# Patient Record
Sex: Female | Born: 1980
Health system: Southern US, Community
[De-identification: ages and names within clinical notes are randomized; demographics above are authoritative.]

## PROBLEM LIST (undated history)

## (undated) DIAGNOSIS — D649 Anemia, unspecified: Secondary | ICD-10-CM

## (undated) DIAGNOSIS — F419 Anxiety disorder, unspecified: Secondary | ICD-10-CM

## (undated) DIAGNOSIS — K5900 Constipation, unspecified: Secondary | ICD-10-CM

## (undated) HISTORY — PX: ELBOW SURGERY: SHX618

## (undated) HISTORY — PX: WISDOM TOOTH EXTRACTION: SHX21

## (undated) HISTORY — DX: Constipation, unspecified: K59.00

## (undated) HISTORY — PX: REDUCTION MAMMAPLASTY: SUR839

## (undated) HISTORY — DX: Anemia, unspecified: D64.9

---

## 2009-03-09 ENCOUNTER — Emergency Department (HOSPITAL_COMMUNITY): Admission: EM | Admit: 2009-03-09 | Discharge: 2009-03-09 | Payer: Self-pay | Admitting: Emergency Medicine

## 2011-12-13 DIAGNOSIS — R079 Chest pain, unspecified: Secondary | ICD-10-CM

## 2017-12-08 DIAGNOSIS — S134XXA Sprain of ligaments of cervical spine, initial encounter: Secondary | ICD-10-CM | POA: Diagnosis not present

## 2017-12-08 DIAGNOSIS — S338XXA Sprain of other parts of lumbar spine and pelvis, initial encounter: Secondary | ICD-10-CM | POA: Diagnosis not present

## 2017-12-08 DIAGNOSIS — S233XXA Sprain of ligaments of thoracic spine, initial encounter: Secondary | ICD-10-CM | POA: Diagnosis not present

## 2017-12-11 DIAGNOSIS — S233XXA Sprain of ligaments of thoracic spine, initial encounter: Secondary | ICD-10-CM | POA: Diagnosis not present

## 2017-12-11 DIAGNOSIS — S134XXA Sprain of ligaments of cervical spine, initial encounter: Secondary | ICD-10-CM | POA: Diagnosis not present

## 2017-12-11 DIAGNOSIS — S338XXA Sprain of other parts of lumbar spine and pelvis, initial encounter: Secondary | ICD-10-CM | POA: Diagnosis not present

## 2017-12-15 DIAGNOSIS — S134XXA Sprain of ligaments of cervical spine, initial encounter: Secondary | ICD-10-CM | POA: Diagnosis not present

## 2017-12-15 DIAGNOSIS — S338XXA Sprain of other parts of lumbar spine and pelvis, initial encounter: Secondary | ICD-10-CM | POA: Diagnosis not present

## 2017-12-15 DIAGNOSIS — S233XXA Sprain of ligaments of thoracic spine, initial encounter: Secondary | ICD-10-CM | POA: Diagnosis not present

## 2017-12-18 DIAGNOSIS — S338XXA Sprain of other parts of lumbar spine and pelvis, initial encounter: Secondary | ICD-10-CM | POA: Diagnosis not present

## 2017-12-18 DIAGNOSIS — S233XXA Sprain of ligaments of thoracic spine, initial encounter: Secondary | ICD-10-CM | POA: Diagnosis not present

## 2017-12-18 DIAGNOSIS — S134XXA Sprain of ligaments of cervical spine, initial encounter: Secondary | ICD-10-CM | POA: Diagnosis not present

## 2017-12-19 DIAGNOSIS — R748 Abnormal levels of other serum enzymes: Secondary | ICD-10-CM | POA: Diagnosis not present

## 2017-12-19 DIAGNOSIS — B349 Viral infection, unspecified: Secondary | ICD-10-CM | POA: Diagnosis not present

## 2017-12-19 DIAGNOSIS — R509 Fever, unspecified: Secondary | ICD-10-CM | POA: Diagnosis not present

## 2017-12-20 DIAGNOSIS — D72829 Elevated white blood cell count, unspecified: Secondary | ICD-10-CM | POA: Diagnosis not present

## 2017-12-20 DIAGNOSIS — R509 Fever, unspecified: Secondary | ICD-10-CM | POA: Diagnosis not present

## 2017-12-25 DIAGNOSIS — S134XXA Sprain of ligaments of cervical spine, initial encounter: Secondary | ICD-10-CM | POA: Diagnosis not present

## 2017-12-25 DIAGNOSIS — S338XXA Sprain of other parts of lumbar spine and pelvis, initial encounter: Secondary | ICD-10-CM | POA: Diagnosis not present

## 2017-12-25 DIAGNOSIS — S233XXA Sprain of ligaments of thoracic spine, initial encounter: Secondary | ICD-10-CM | POA: Diagnosis not present

## 2018-01-08 DIAGNOSIS — S233XXA Sprain of ligaments of thoracic spine, initial encounter: Secondary | ICD-10-CM | POA: Diagnosis not present

## 2018-01-08 DIAGNOSIS — S134XXA Sprain of ligaments of cervical spine, initial encounter: Secondary | ICD-10-CM | POA: Diagnosis not present

## 2018-01-08 DIAGNOSIS — S338XXA Sprain of other parts of lumbar spine and pelvis, initial encounter: Secondary | ICD-10-CM | POA: Diagnosis not present

## 2018-03-20 DIAGNOSIS — N6320 Unspecified lump in the left breast, unspecified quadrant: Secondary | ICD-10-CM | POA: Diagnosis not present

## 2018-04-08 DIAGNOSIS — R922 Inconclusive mammogram: Secondary | ICD-10-CM | POA: Diagnosis not present

## 2018-04-08 DIAGNOSIS — N6325 Unspecified lump in the left breast, overlapping quadrants: Secondary | ICD-10-CM | POA: Diagnosis not present

## 2018-04-08 DIAGNOSIS — N6489 Other specified disorders of breast: Secondary | ICD-10-CM | POA: Diagnosis not present

## 2018-04-08 DIAGNOSIS — Z803 Family history of malignant neoplasm of breast: Secondary | ICD-10-CM | POA: Diagnosis not present

## 2018-04-14 DIAGNOSIS — Z299 Encounter for prophylactic measures, unspecified: Secondary | ICD-10-CM | POA: Diagnosis not present

## 2018-04-14 DIAGNOSIS — R6889 Other general symptoms and signs: Secondary | ICD-10-CM | POA: Diagnosis not present

## 2018-04-14 DIAGNOSIS — Z6822 Body mass index (BMI) 22.0-22.9, adult: Secondary | ICD-10-CM | POA: Diagnosis not present

## 2018-05-20 DIAGNOSIS — Z6821 Body mass index (BMI) 21.0-21.9, adult: Secondary | ICD-10-CM | POA: Diagnosis not present

## 2018-05-20 DIAGNOSIS — J019 Acute sinusitis, unspecified: Secondary | ICD-10-CM | POA: Diagnosis not present

## 2018-05-20 DIAGNOSIS — F17210 Nicotine dependence, cigarettes, uncomplicated: Secondary | ICD-10-CM | POA: Diagnosis not present

## 2018-11-23 ENCOUNTER — Other Ambulatory Visit: Payer: Self-pay | Admitting: *Deleted

## 2018-11-23 DIAGNOSIS — Z20822 Contact with and (suspected) exposure to covid-19: Secondary | ICD-10-CM

## 2018-11-24 LAB — NOVEL CORONAVIRUS, NAA: SARS-CoV-2, NAA: NOT DETECTED

## 2019-01-19 ENCOUNTER — Other Ambulatory Visit: Payer: Self-pay

## 2019-01-19 DIAGNOSIS — Z20822 Contact with and (suspected) exposure to covid-19: Secondary | ICD-10-CM

## 2019-01-20 LAB — NOVEL CORONAVIRUS, NAA: SARS-CoV-2, NAA: NOT DETECTED

## 2019-03-22 ENCOUNTER — Ambulatory Visit
Admission: EM | Admit: 2019-03-22 | Discharge: 2019-03-22 | Disposition: A | Discharge status: Home / Self Care | Payer: 59 | Attending: Emergency Medicine | Admitting: Emergency Medicine

## 2019-03-22 ENCOUNTER — Other Ambulatory Visit: Payer: Self-pay

## 2019-03-22 DIAGNOSIS — R509 Fever, unspecified: Secondary | ICD-10-CM

## 2019-03-22 DIAGNOSIS — M791 Myalgia, unspecified site: Secondary | ICD-10-CM

## 2019-03-22 DIAGNOSIS — R197 Diarrhea, unspecified: Secondary | ICD-10-CM

## 2019-03-22 DIAGNOSIS — R519 Headache, unspecified: Secondary | ICD-10-CM | POA: Diagnosis not present

## 2019-03-22 DIAGNOSIS — Z20828 Contact with and (suspected) exposure to other viral communicable diseases: Secondary | ICD-10-CM

## 2019-03-22 DIAGNOSIS — Z20822 Contact with and (suspected) exposure to covid-19: Secondary | ICD-10-CM

## 2019-03-22 LAB — POCT URINALYSIS DIP (MANUAL ENTRY)
Bilirubin, UA: NEGATIVE
Blood, UA: NEGATIVE
Glucose, UA: NEGATIVE mg/dL
Ketones, POC UA: NEGATIVE mg/dL
Leukocytes, UA: NEGATIVE
Nitrite, UA: NEGATIVE
Protein Ur, POC: NEGATIVE mg/dL
Spec Grav, UA: 1.025 (ref 1.010–1.025)
Urobilinogen, UA: 1 E.U./dL
pH, UA: 7 (ref 5.0–8.0)

## 2019-03-22 MED ORDER — ONDANSETRON 8 MG PO TBDP: ORAL_TABLET | ORAL | 0 refills | Status: DC

## 2019-03-22 MED ORDER — IBUPROFEN 600 MG PO TABS: 600.0000 mg | ORAL_TABLET | Freq: Four times a day (QID) | ORAL | 0 refills | Status: DC | PRN

## 2019-03-22 NOTE — Discharge Instructions (Signed)
Zofran will help with the nausea and also make you constipated so we will slow the diarrhea down.  You can try Imodium if the diarrhea comes back despite the Zofran.  Take 600 mg ibuprofen with 1000 g of Tylenol 3-4 times a day as needed for body aches, headaches.  Push plenty of electrolyte containing fluids such as Pedialyte or Gatorade until your urine is clear.  Your Covid test will be back in 18 to 36 hours.

## 2019-03-22 NOTE — ED Provider Notes (Signed)
HPI  SUBJECTIVE:  Jill Pineda is a 38 y.o. female who presents with headaches, body aches starting yesterday.  She reports fatigue, fevers T-max 100.4 and diarrhea starting today.  She states that she was on the toilet "for 45 minutes" with multiple episodes of watery, nonbloody diarrhea.  She reports diffuse crampy abdominal pain before having a bowel movement, which got better after having a bowel movement.  She states it has resolved.  She reports decreased appetite, nasal congestion, altered taste, cough, nausea and one episode of emesis.  No sore throat, loss of sense of smell, shortness of breath.  No known exposure to Covid.  No raw or undercooked foods, questionable leftovers, no contacts with diarrheal illness.  She does not take any medications on regular basis.  No change in urine output.  No abdominal distention, urinary complaints.  No antipyretic in the past 4 to 6 hours.  She tried Pepto-Bismol which she vomited up.  No aggravating or alleviating factors.  She is a smoker has a history of UTI, C-sections x2.  No history of coronary disease, diabetes, HIV, hypertension, chronic kidney disease, immunocompromise , pyelonephritis, nephrolithiasis.  LMP: 1 week ago.  Denies the possibility of being pregnant.  PMD: Dr. Clelia Croft     History reviewed. No pertinent past medical history.  Past Surgical History:  Procedure Laterality Date  . ELBOW SURGERY      Family History  Problem Relation Age of Onset  . Healthy Mother   . Healthy Father     Social History   Tobacco Use  . Smoking status: Current Every Day Smoker    Packs/day: 0.50  . Smokeless tobacco: Never Used  Substance Use Topics  . Alcohol use: Not on file  . Drug use: Not on file    No current facility-administered medications for this encounter.  Current Outpatient Medications:  .  ibuprofen (ADVIL) 600 MG tablet, Take 1 tablet (600 mg total) by mouth every 6 (six) hours as needed., Disp: 30 tablet, Rfl: 0 .   ondansetron (ZOFRAN ODT) 8 MG disintegrating tablet, 1/2- 1 tablet q 8 hr prn nausea, vomiting, Disp: 20 tablet, Rfl: 0  Allergies  Allergen Reactions  . Celebrex [Celecoxib]      ROS  As noted in HPI.   Physical Exam  BP (!) 107/56 (BP Location: Right Arm)   Pulse 85   Temp 98.6 F (37 C) (Oral)   Resp 16   SpO2 98%   Constitutional: Well developed, well nourished, no acute distress.  Appears ill. Eyes: PERRLA, EOMI, conjunctiva normal bilaterally.  No direct or consensual photophobia. HENT: Normocephalic, atraumatic,mucus membranes moist.  Positive nasal congestion.  No sinus tenderness.  Normal oropharynx with normal tonsils uvula midline no postnasal drip Neck: Positive shotty cervical lymphadenopathy Respiratory: Normal inspiratory effort, lungs clear bilaterally good air movement cardiovascular: Normal rate regular rhythm no murmurs rubs or gallops GI: Normal appearance, soft.  Positive suprapubic tenderness.  No other tenderness.  Active bowel sounds, no distention, no guarding, rebound Back: No CVAT skin: No rash, skin intact Musculoskeletal: no deformities Neurologic: Alert & oriented x 3, no focal neuro deficits Psychiatric: Speech and behavior appropriate   ED Course   Medications - No data to display  Orders Placed This Encounter  Procedures  . Novel Coronavirus, NAA ([facility])    Standing Status:   Standing    Number of Occurrences:   1  . POCT urinalysis dipstick    Standing Status:   Standing  Number of Occurrences:   1    Results for orders placed or performed during the hospital encounter of 03/22/19 (from the past 24 hour(s))  POCT urinalysis dipstick     Status: Abnormal   Collection Time: 03/22/19 12:31 PM  Result Value Ref Range   Color, UA yellow yellow   Clarity, UA clear (A) clear   Glucose, UA negative negative mg/dL   Bilirubin, UA negative negative   Ketones, POC UA negative negative mg/dL   Spec Grav, UA 1.025 1.010 - 1.025    Blood, UA negative negative   pH, UA 7.0 5.0 - 8.0   Protein Ur, POC negative negative mg/dL   Urobilinogen, UA 1.0 0.2 or 1.0 E.U./dL   Nitrite, UA Negative Negative   Leukocytes, UA Negative Negative   No results found.  ED Clinical Impression  1. Diarrhea, unspecified type   2. Encounter for laboratory testing for COVID-19 virus      ED Assessment/Plan  Covid PCR sent.  However will check urinalysis due to suprapubic tenderness.  While patient does appear ill, she does not appear toxic.  She appears well-hydrated.  Her abdomen is benign.  Plan to send home with Zofran, may take Imodium as needed, ibuprofen 600 mg, with 1 g of Tylenol 3-4 times a day.  Push fluids.  Follow-up with PMD in several days, to the ER if she gets worse.  Urinalysis negative for UTI.  Suspect abdominal tenderness is from the diarrhea that she had this morning.  Plan as above  Covid PCR negative.  Discussed labs,  MDM, treatment plan, and plan for follow-up with patient. Discussed sn/sx that should prompt return to the ED. patient agrees with plan.   Meds ordered this encounter  Medications  . ondansetron (ZOFRAN ODT) 8 MG disintegrating tablet    Sig: 1/2- 1 tablet q 8 hr prn nausea, vomiting    Dispense:  20 tablet    Refill:  0  . ibuprofen (ADVIL) 600 MG tablet    Sig: Take 1 tablet (600 mg total) by mouth every 6 (six) hours as needed.    Dispense:  30 tablet    Refill:  0    *This clinic note was created using [employer]. Therefore, there may be occasional mistakes despite careful proofreading.   ?   Melynda Ripple, MD 03/24/19 1152

## 2019-03-22 NOTE — ED Triage Notes (Signed)
Pt presents to UC w/ c/o body aches, coughing, chills, headache, diarrhea since yesterday.

## 2019-03-23 LAB — NOVEL CORONAVIRUS, NAA: SARS-CoV-2, NAA: NOT DETECTED

## 2019-05-21 ENCOUNTER — Encounter: Payer: Self-pay | Admitting: Family Medicine

## 2019-06-28 ENCOUNTER — Encounter: Payer: Self-pay | Admitting: Internal Medicine

## 2019-07-12 NOTE — Progress Notes (Signed)
Primary Care Physician:  Kirstie Peri, MD  Referring Physician: Dr. Sherryll Burger  Primary Gastroenterologist:  Dr. Jena Gauss  Chief Complaint  Patient presents with  . Hemorrhoids    has bleeding with bmc  . Constipation  . Bloated    HPI:   Jill Pineda is a 39 y.o. female presenting today at the request of Dr. Sherryll Burger  due to hemorrhoids.   Chronic hemorrhoids and would treat with supportive care OTC creams. Now for the past 2 months worsening with pain. Hurts with laying down, cough. Bristol stool scale #1. Constipation worsened for a month. Normally BM once a week. Feels bloated. Occasional rectal bleeding with straining. For constipation: was given OTC recommendations. No weight loss or lack of appetite. No prior colonoscopy. No family history of colon cancer or polyps.   Did have a pulling sensation in LLQ that was worsened with movement but now resolved. In last 3 days if drinking fast will feel like she is going to vomit.   History of chronic anemia dating back years. Heavy periods. Will be seeing GYN in the future. Tries to take iron daily. Doesn't believe she has taken in 2 weeks. Stopped due to constipation.   Wants to hold off on colonoscopy right now but will consider at next appt.   Past Medical History:  Diagnosis Date  . Anemia   . Constipation     Past Surgical History:  Procedure Laterality Date  . CESAREAN SECTION     X 2  . ELBOW SURGERY      Current Outpatient Medications  Medication Sig Dispense Refill  . ibuprofen (ADVIL) 600 MG tablet Take 1 tablet (600 mg total) by mouth every 6 (six) hours as needed. 30 tablet 0  . ondansetron (ZOFRAN ODT) 8 MG disintegrating tablet 1/2- 1 tablet q 8 hr prn nausea, vomiting 20 tablet 0   No current facility-administered medications for this visit.    Allergies as of 07/13/2019 - Review Complete 07/13/2019  Allergen Reaction Noted  . Celebrex [celecoxib]  03/22/2019    Family History  Problem Relation Age of  Onset  . Healthy Mother   . Healthy Father     Social History   Socioeconomic History  . Marital status: Married    Spouse name: Not on file  . Number of children: Not on file  . Years of education: Not on file  . Highest education level: Not on file  Occupational History  . 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 Smoking status: Current Every Day Smoker    Packs/day: 0.50    Types: Cigarettes  . Smokeless tobacco: Never Used  Substance and Sexual Activity  . Alcohol use: Yes    Comment: occ  . Drug use: Never  . Sexual activity: Not on file  Other Topics Concern  . Not on file  Social History Narrative  . Not on file   Social Determinants of Health   Financial Resource Strain:   . Difficulty of Paying Living Expenses:   Food Insecurity:   . Worried About Programme researcher, broadcasting/film/video in the Last Year:   . Barista in the Last Year:   Transportation Needs:   . Freight forwarder (Medical):   Marland Kitchen Lack of Transportation (Non-Medical):   Physical Activity:   . Days of Exercise per Week:   . Minutes of Exercise per Session:   Stress:   . Feeling of Stress :   Social  Connections:   . Frequency of Communication with Friends and Family:   . Frequency of Social Gatherings with Friends and Family:   . Attends Religious Services:   . Active Member of Clubs or Organizations:   . Attends Archivist Meetings:   Marland Kitchen Marital Status:   Intimate Partner Violence:   . Fear of Current or Ex-Partner:   . Emotionally Abused:   Marland Kitchen Physically Abused:   . Sexually Abused:     Review of Systems: Gen: Denies any fever, chills, fatigue, weight loss, lack of appetite.  CV: Denies chest pain, heart palpitations, peripheral edema, syncope.  Resp: Denies shortness of breath at rest or with exertion. Denies wheezing or cough.  GI: see HPI  GU : Denies urinary burning, urinary frequency, urinary hesitancy MS: Denies joint pain, muscle weakness, cramps, or limitation of movement.   Derm: Denies rash, itching, dry skin Psych: Denies depression, anxiety, memory loss, and confusion Heme: see HPI  Physical Exam: BP (!) 106/58   Pulse 66   Temp (!) 96.8 F (36 C) (Temporal)   Ht 5\' 6"  (1.676 m)   Wt 136 lb (61.7 kg)   LMP 06/19/2019 (Approximate)   BMI 21.95 kg/m  General:   Alert and oriented. Pleasant and cooperative. Well-nourished and well-developed.  Head:  Normocephalic and atraumatic. Eyes:  Without icterus, sclera clear and conjunctiva pink.  Ears:  Normal auditory acuity. Mouth:  Mask in place Lungs:  Clear to auscultation bilaterally.  Heart:  S1, S2 present without murmurs appreciated.  Abdomen:  +BS, soft, non-tender and non-distended. No HSM noted. No guarding or rebound. No masses appreciated.  Rectal:  Small external hemorrhoid tags, small internal hemorrhoid prolapse easily reduced. Internal exam with some discomfort but no mass.  Msk:  Symmetrical without gross deformities. Normal posture. Extremities:  Without edema. Neurologic:  Alert and  oriented x4;  grossly normal neurologically. Skin:  Intact without significant lesions or rashes. Psych:  Alert and cooperative. Normal mood and affect.  ASSESSMENT: Jill Pineda is a 39 y.o. female with constipation and symptomatic hemorrhoids Grade 2-3, low-volume hematochezia with straining likely due to internal hemorrhoids and doubt occult malignancy or other process, presenting as a new patient at the request of Dr. Manuella Ghazi.  Internal hemorrhoids: likely source of bleeding. However, we discussed that a colonoscopy would be indicated in this scenario to rule out other causes. She is hesistant to pursue at this point but willing to discuss at next visit. Will treat with anusol BID, add lidocaine prn, and have her call if no improvement. Doubt dealing with anal fissure. Would be a good candidate for hemorrhoid banding in the future.  Constipation: start Linzess 290 mcg once daily. Samples provided. Call  with update.   Anemia: per patient. Noting heavy menstrual cycle chronically. Will request labs from PCP.    PLAN:  Anusol BID, lidocaine ointment prn  Avoid straining, limit toilet time to 2-3 minutes  Linzess 290 mcg samples provided  Recommend colonoscopy when patient is willing  Return for close follow-up in 6 weeks  Obtain outside labs from PCP  Annitta Needs, PhD, ANP-BC [facility]

## 2019-07-13 ENCOUNTER — Other Ambulatory Visit: Payer: Self-pay

## 2019-07-13 ENCOUNTER — Encounter: Payer: Self-pay | Admitting: Gastroenterology

## 2019-07-13 ENCOUNTER — Ambulatory Visit (INDEPENDENT_AMBULATORY_CARE_PROVIDER_SITE_OTHER): Payer: 59 | Admitting: Gastroenterology

## 2019-07-13 DIAGNOSIS — K5900 Constipation, unspecified: Secondary | ICD-10-CM | POA: Diagnosis not present

## 2019-07-13 DIAGNOSIS — K625 Hemorrhage of anus and rectum: Secondary | ICD-10-CM | POA: Diagnosis not present

## 2019-07-13 MED ORDER — HYDROCORTISONE (PERIANAL) 2.5 % EX CREA: 1.0000 "application " | TOPICAL_CREAM | Freq: Two times a day (BID) | CUTANEOUS | 1 refills | Status: DC

## 2019-07-13 NOTE — Patient Instructions (Signed)
I have sent in a rectal cream to use twice a day per rectum.   You may get lidocaine ointment over the counter to use per rectum as needed for discomfort. This helps numb.  For constipation: start taking Linzess one capsule each morning on an empty stomach, at least 30 minutes before eating to avoid looser stool. You may have loose stool starting out but should get better. If not, please call, and we can adjust the dosage.  I recommend Benefiber 2 teaspoons daily. I have provided a handout to help with generic option choices.  We will see you in 6 weeks!  Please call if no improvement in next 1-2 weeks!  It was a pleasure to see you today. I want to create trusting relationships with patients to provide genuine, compassionate, and quality care. I value your feedback. If you receive a survey regarding your visit,  I greatly appreciate you taking time to fill this out.   Gelene Mink, PhD, ANP-BC [facility]

## 2019-08-25 ENCOUNTER — Telehealth: Payer: Self-pay | Admitting: Emergency Medicine

## 2019-08-25 ENCOUNTER — Other Ambulatory Visit: Payer: Self-pay

## 2019-08-25 ENCOUNTER — Encounter: Payer: Self-pay | Admitting: Gastroenterology

## 2019-08-25 ENCOUNTER — Ambulatory Visit (INDEPENDENT_AMBULATORY_CARE_PROVIDER_SITE_OTHER): Payer: 59 | Admitting: Gastroenterology

## 2019-08-25 ENCOUNTER — Telehealth: Payer: Self-pay | Admitting: Internal Medicine

## 2019-08-25 VITALS — BP 112/68 | HR 71 | Temp 97.7°F | Ht 66.0 in | Wt 134.8 lb

## 2019-08-25 DIAGNOSIS — D509 Iron deficiency anemia, unspecified: Secondary | ICD-10-CM

## 2019-08-25 DIAGNOSIS — K5900 Constipation, unspecified: Secondary | ICD-10-CM

## 2019-08-25 DIAGNOSIS — K625 Hemorrhage of anus and rectum: Secondary | ICD-10-CM | POA: Diagnosis not present

## 2019-08-25 MED ORDER — LINACLOTIDE 290 MCG PO CAPS: 290.0000 ug | ORAL_CAPSULE | Freq: Every day | ORAL | 3 refills | Status: DC

## 2019-08-25 MED ORDER — LUBIPROSTONE 24 MCG PO CAPS: 24.0000 ug | ORAL_CAPSULE | Freq: Two times a day (BID) | ORAL | 3 refills | Status: DC

## 2019-08-25 MED ORDER — SUPREP BOWEL PREP KIT 17.5-3.13-1.6 GM/177ML PO SOLN: 1.0000 | ORAL | 0 refills | Status: DC

## 2019-08-25 NOTE — Telephone Encounter (Signed)
Pt insurance denied amitiza sent in by provider, pt notified

## 2019-08-25 NOTE — Addendum Note (Signed)
Addended by: Gelene Mink on: 08/25/2019 04:19 PM   Modules accepted: Orders

## 2019-08-25 NOTE — Telephone Encounter (Signed)
Pt insurance company is denying linzess . Pt must try amitizia caps and motegrity 2mg  tabs first pt [facility] 

## 2019-08-25 NOTE — Telephone Encounter (Signed)
Pt returning call. [phone]

## 2019-08-25 NOTE — Telephone Encounter (Signed)
Sent Amitiza into pharmacy. Make sure she takes with food to avoid nausea.

## 2019-08-25 NOTE — Progress Notes (Addendum)
Referring Provider: Monico Blitz, MD Primary Care Physician:  Monico Blitz, MD  Primary GI: Dr. Gala Romney   Chief Complaint  Patient presents with  . Hemorrhoids    painful, bleeding at times, has inside/outside hem, straining with BM    HPI:   Jill Pineda is a 39 y.o. female presenting today with a history of symptomatic hemorrhoids, constipation, rectal bleeding, to arrange colonoscopy. At last visit, she was unsure if she wanted to proceed. Bowel regimen recommended, along with Anusol. Here for follow-up. Would like to pursue colonoscopy now.   Outside labs Feb 2021 with iron sats 14, iron 53, TSH 0.891, no H/H. Outside Hgb 11.7 in Oct 2020 through [facility].   Sometimes pain with BM. If doesn't take Linzess has to strain really hard. Willing to do a colonoscopy now. Rectal pain still present. Uses Anusol with lidocaine. Not a lot of bleeding. Has calmed down some. Feels bloated after eating. Chews ice all the time.   Has upcoming appt with PCP today. Feels fatigued. Has had headaches for the last 3 months.     Past Medical History:  Diagnosis Date  . Anemia   . Constipation     Past Surgical History:  Procedure Laterality Date  . CESAREAN SECTION     X 2  . ELBOW SURGERY      Current Outpatient Medications  Medication Sig Dispense Refill  . hydrocortisone (ANUSOL-HC) 2.5 % rectal cream Place 1 application rectally 2 (two) times daily. (Patient taking differently: Place 1 application rectally 2 (two) times daily. As needed) 30 g 1  . ibuprofen (ADVIL) 600 MG tablet Take 1 tablet (600 mg total) by mouth every 6 (six) hours as needed. 30 tablet 0  . ondansetron (ZOFRAN ODT) 8 MG disintegrating tablet 1/2- 1 tablet q 8 hr prn nausea, vomiting 20 tablet 0  . linaclotide (LINZESS) 290 MCG CAPS capsule Take 1 capsule (290 mcg total) by mouth daily before breakfast. 90 capsule 3   No current facility-administered medications for this visit.    Allergies as of  08/25/2019 - Review Complete 08/25/2019  Allergen Reaction Noted  . Celebrex [celecoxib]  03/22/2019    Family History  Problem Relation Age of Onset  . Healthy Mother   . Healthy Father     Social History   Socioeconomic History  . Marital status: Married    Spouse name: Not on file  . Number of children: Not on file  . Years of education: Not on file  . Highest education level: Not on file  Occupational History  . 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 Smoking status: Current Every Day Smoker    Packs/day: 0.50    Types: Cigarettes  . Smokeless tobacco: Never Used  Substance and Sexual Activity  . Alcohol use: Yes    Comment: occ  . Drug use: Never  . Sexual activity: Not on file  Other Topics Concern  . Not on file  Social History Narrative  . Not on file   Social Determinants of Health   Financial Resource Strain:   . Difficulty of Paying Living Expenses:   Food Insecurity:   . Worried About Charity fundraiser in the Last Year:   . Arboriculturist in the Last Year:   Transportation Needs:   . Film/video editor (Medical):   Marland Kitchen Lack of Transportation (Non-Medical):   Physical Activity:   . Days of Exercise per Week:   . Minutes  of Exercise per Session:   Stress:   . Feeling of Stress :   Social Connections:   . Frequency of Communication with Friends and Family:   . Frequency of Social Gatherings with Friends and Family:   . Attends Religious Services:   . Active Member of Clubs or Organizations:   . Attends Archivist Meetings:   Marland Kitchen Marital Status:     Review of Systems: Gen: Denies fever, chills, anorexia. Denies fatigue, weakness, weight loss.  CV: Denies chest pain, palpitations, syncope, peripheral edema, and claudication. Resp: Denies dyspnea at rest, cough, wheezing, coughing up blood, and pleurisy. GI: see HPI Derm: Denies rash, itching, dry skin Psych: Denies depression, anxiety, memory loss, confusion. No homicidal or suicidal  ideation.  Heme: see HPI  Physical Exam: BP 112/68   Pulse 71   Temp 97.7 F (36.5 C) (Oral)   Ht 5' 6"  (1.676 m)   Wt 134 lb 12.8 oz (61.1 kg)   LMP 08/20/2019   BMI 21.76 kg/m  General:   Alert and oriented. No distress noted. Pleasant and cooperative.  Head:  Normocephalic and atraumatic. Eyes:  Conjuctiva clear without scleral icterus. Mouth:  Mask in place Cardiac: S1 S2 present without murmurs Lungs: Clear bilaterally Abdomen:  +BS, soft, non-tender and non-distended. No rebound or guarding. No HSM or masses noted. Msk:  Symmetrical without gross deformities. Normal posture. Extremities:  Without edema. Neurologic:  Alert and  oriented x4 Psych:  Alert and cooperative. Normal mood and affect.  ASSESSMENT: Jill Pineda is a 39 y.o. female presenting today with a history of rectal bleeding, symptomatic hemorrhoids, concern for fissure, and IDA. She is noting significant fatigue, eating ice, and I am concerned her iron deficiency is worsening. May need iron infusions, avoiding oral iron as this is compounding constipation.   IDA: check celiac serologies. CBC, iron studies. She is to see PCP and will have done today.   Rectal bleeding: multifactorial in setting of known prolapsing internal hemorrhoids seen on prior exam and concern for fissure. [facility] cream with Nitro called into pharmacy. Pursue colonoscopy in near future.   Constipation: continue Linzess 290 mcg once daily.,    PLAN:   Proceed with TCS with Dr. Gala Romney in near future: the risks, benefits, and alternatives have been discussed with the patient in detail. The patient states understanding and desires to proceed.  [facility] cream with Nitro sent to pharmacy, take QID  Continue Linzess 290 mcg daily  CBC, iron studies, celiac serologies now  Return in follow-up after colonoscopy to ensure improving and pursue banding if rectal pain resolved   Annitta Needs, PhD, ANP-BC [facility]  Gastroenterology   ADDENDUM 7/12: Outside labs received dated September 22, 2019: Hgb 12.0, Hct 39.4, Platelets 275, creatinine 0.75, BUN 9, tbili 0.6, Alk Phos 62, AST 16, ALT 12.   May 2021, Hgb was 11.8, Hct 36.2. ferritin low at 6 in May 2021.   Hgb has improved since May 2021. NO celiac serologies completed. Will reach out to patient to have done.  Annitta Needs, PhD, ANP-BC [facility]

## 2019-08-25 NOTE — Telephone Encounter (Signed)
Routing to LL 

## 2019-08-25 NOTE — Telephone Encounter (Signed)
Pt.notified

## 2019-08-25 NOTE — Patient Instructions (Signed)
PA for TCS submitted via [facility] website. Case approved. PA# [id number], valid 10/13/19-01/11/20.

## 2019-08-25 NOTE — Telephone Encounter (Signed)
Called lmom

## 2019-08-25 NOTE — Patient Instructions (Signed)
I have called in a cream to [place]. You will apply this 4 times a day per rectum. This is to help heal any anal tear that may be there.  Continue Linzess as you are doing.  Please have blood work done with primary care. We will look out for them. We can arrange iron infusions if needed.   We will see you after the colonoscopy for likely banding, but we need to heal any possible co-existing fissure that is there.  I enjoyed seeing you again today! As you know, I value our relationship and want to provide genuine, compassionate, and quality care. I welcome your feedback. If you receive a survey regarding your visit,  I greatly appreciate you taking time to fill this out. See you next time!  Gelene Mink, PhD, ANP-BC [facility]astroenter[facility]

## 2019-09-21 ENCOUNTER — Encounter: Payer: Self-pay | Admitting: Gastroenterology

## 2019-09-24 ENCOUNTER — Telehealth: Payer: Self-pay | Admitting: Gastroenterology

## 2019-09-24 NOTE — Telephone Encounter (Signed)
Darl Pikes, can we get labs done with PCP recently? I had ordered at office visit in May but haven't seen them come through.

## 2019-09-29 NOTE — Telephone Encounter (Signed)
Requested labs from PCP 

## 2019-10-11 ENCOUNTER — Other Ambulatory Visit: Payer: Self-pay

## 2019-10-11 DIAGNOSIS — K909 Intestinal malabsorption, unspecified: Secondary | ICD-10-CM

## 2019-10-11 NOTE — Telephone Encounter (Signed)
Outside labs received dated September 22, 2019:   Hgb 12.0, Hct 39.4, Platelets 275, creatinine 0.75, BUN 9, tbili 0.6, Alk Phos 62, AST 16, ALT 12.   May 2021, Hgb was 11.8, Hct 36.2. ferritin low at 6 in May 2021.   Hgb has improved

## 2019-10-11 NOTE — Telephone Encounter (Signed)
Still have not received celiac serologies. Unclear if she ever had this done, as I don't see them with the labs recently sent.  Can we please have her complete IgA, TTg, IgA through [facility].

## 2019-10-11 NOTE — Telephone Encounter (Signed)
Spoke with pt. Pt is aware that outside labs were viewed. Pt is going to have labs completed tomorrow 10/12/19. Lab orders placed and faxed to [facility].

## 2019-10-12 ENCOUNTER — Other Ambulatory Visit (HOSPITAL_COMMUNITY)
Admission: RE | Admit: 2019-10-12 | Discharge: 2019-10-12 | Disposition: A | Discharge status: Home / Self Care | Payer: 59 | Source: Ambulatory Visit | Attending: Internal Medicine | Admitting: Internal Medicine

## 2019-10-12 ENCOUNTER — Other Ambulatory Visit: Payer: Self-pay

## 2019-10-12 ENCOUNTER — Other Ambulatory Visit (HOSPITAL_COMMUNITY)
Admission: RE | Admit: 2019-10-12 | Discharge: 2019-10-12 | Disposition: A | Discharge status: Home / Self Care | Payer: 59 | Source: Ambulatory Visit | Attending: Gastroenterology | Admitting: Gastroenterology

## 2019-10-12 DIAGNOSIS — Z20822 Contact with and (suspected) exposure to covid-19: Secondary | ICD-10-CM | POA: Diagnosis not present

## 2019-10-12 DIAGNOSIS — K909 Intestinal malabsorption, unspecified: Secondary | ICD-10-CM | POA: Diagnosis not present

## 2019-10-12 LAB — SARS CORONAVIRUS 2 (TAT 6-24 HRS): SARS Coronavirus 2: NEGATIVE

## 2019-10-13 ENCOUNTER — Encounter (HOSPITAL_COMMUNITY): Payer: Self-pay | Admitting: Internal Medicine

## 2019-10-13 ENCOUNTER — Other Ambulatory Visit: Payer: Self-pay

## 2019-10-13 ENCOUNTER — Encounter (HOSPITAL_COMMUNITY)
Admission: RE | Disposition: A | Discharge status: Home / Self Care | Payer: Self-pay | Source: Home / Self Care | Attending: Internal Medicine

## 2019-10-13 ENCOUNTER — Ambulatory Visit (HOSPITAL_COMMUNITY)
Admission: RE | Admit: 2019-10-13 | Discharge: 2019-10-13 | Disposition: A | Discharge status: Home / Self Care | Payer: 59 | Attending: Internal Medicine | Admitting: Internal Medicine

## 2019-10-13 DIAGNOSIS — K921 Melena: Secondary | ICD-10-CM | POA: Diagnosis not present

## 2019-10-13 DIAGNOSIS — Z79899 Other long term (current) drug therapy: Secondary | ICD-10-CM | POA: Diagnosis not present

## 2019-10-13 DIAGNOSIS — D509 Iron deficiency anemia, unspecified: Secondary | ICD-10-CM | POA: Insufficient documentation

## 2019-10-13 DIAGNOSIS — F17210 Nicotine dependence, cigarettes, uncomplicated: Secondary | ICD-10-CM | POA: Diagnosis not present

## 2019-10-13 DIAGNOSIS — K641 Second degree hemorrhoids: Secondary | ICD-10-CM | POA: Insufficient documentation

## 2019-10-13 HISTORY — PX: COLONOSCOPY: SHX5424

## 2019-10-13 LAB — IGA: IgA: 140 mg/dL (ref 87–352)

## 2019-10-13 LAB — TISSUE TRANSGLUTAMINASE, IGA: Tissue Transglutaminase Ab, IgA: <2 U/mL (ref 0–3)

## 2019-10-13 SURGERY — COLONOSCOPY
Anesthesia: Moderate Sedation

## 2019-10-13 MED ORDER — MEPERIDINE HCL 100 MG/ML IJ SOLN
INTRAMUSCULAR | Status: DC | PRN
  Administered 2019-10-13 (×2): 25 mg via INTRAVENOUS

## 2019-10-13 MED ORDER — MIDAZOLAM HCL 5 MG/5ML IJ SOLN
INTRAMUSCULAR | Status: AC
  Filled 2019-10-13: qty 10

## 2019-10-13 MED ORDER — ONDANSETRON HCL 4 MG/2ML IJ SOLN
INTRAMUSCULAR | Status: DC | PRN
  Administered 2019-10-13: 4 mg via INTRAVENOUS

## 2019-10-13 MED ORDER — STERILE WATER FOR IRRIGATION IR SOLN
Status: DC | PRN
  Administered 2019-10-13: 1.5 mL

## 2019-10-13 MED ORDER — ONDANSETRON HCL 4 MG/2ML IJ SOLN
INTRAMUSCULAR | Status: AC
  Filled 2019-10-13: qty 2

## 2019-10-13 MED ORDER — MIDAZOLAM HCL 5 MG/5ML IJ SOLN
INTRAMUSCULAR | Status: DC | PRN
  Administered 2019-10-13: 2 mg via INTRAVENOUS
  Administered 2019-10-13: 1 mg via INTRAVENOUS
  Administered 2019-10-13 (×2): 2 mg via INTRAVENOUS
  Administered 2019-10-13: 1 mg via INTRAVENOUS

## 2019-10-13 MED ORDER — MEPERIDINE HCL 50 MG/ML IJ SOLN
INTRAMUSCULAR | Status: AC
  Filled 2019-10-13: qty 1

## 2019-10-13 MED ORDER — SODIUM CHLORIDE 0.9 % IV SOLN: INTRAVENOUS | Status: DC

## 2019-10-13 NOTE — H&P (Signed)
@LOGO @   Primary Care Physician:  Monico Blitz, MD Primary Gastroenterologist:  Dr. Gala Romney  Pre-Procedure History & Physical: HPI:  Jill Pineda is a 39 y.o. female here for diagnostic colonoscopy.  History iron deficiency anemia rectal pain and bleeding.  Latter symptoms have improved with regular use of Linzess.  Past Medical History:  Diagnosis Date  . Anemia   . Constipation     Past Surgical History:  Procedure Laterality Date  . CESAREAN SECTION     X 2  . ELBOW SURGERY    . WISDOM TOOTH EXTRACTION      Prior to Admission medications   Medication Sig Start Date End Date Taking? Authorizing Provider  acetaminophen (TYLENOL) 500 MG tablet Take 500 mg by mouth every 8 (eight) hours as needed for moderate pain or headache.   Yes [provider]  ALPRAZolam (XANAX) 0.25 MG tablet Take 0.25 mg by mouth daily as needed for anxiety. 09/21/19  Yes [provider]  hydrocortisone (ANUSOL-HC) 2.5 % rectal cream Place 1 application rectally 2 (two) times daily. Patient taking differently: Place 1 application rectally 2 (two) times daily as needed (discomfort).  07/13/19  Yes Annitta Needs, NP  linaclotide [facility]) 290 MCG CAPS capsule Take 1 capsule (290 mcg total) by mouth daily before breakfast. 08/25/19  Yes Annitta Needs, NP  loratadine (CLARITIN) 10 MG tablet Take 10 mg by mouth daily as needed for allergies.   Yes [provider]  Na Sulfate-K Sulfate-Mg Sulf (SUPREP BOWEL PREP KIT) 17.5-3.13-1.6 GM/177ML SOLN Take 1 kit by mouth as directed. 08/25/19  Yes Hatsumi Steinhart, Cristopher Estimable, MD  lubiprostone (AMITIZA) 24 MCG capsule Take 1 capsule (24 mcg total) by mouth 2 (two) times daily with a meal. Patient not taking: Reported on 10/05/2019 08/25/19   Annitta Needs, NP    Allergies as of 08/25/2019 - Review Complete 08/25/2019  Allergen Reaction Noted  . Celebrex [celecoxib]  03/22/2019    Family History  Problem Relation Age of Onset  . Healthy Mother   . Healthy  Father     Social History   Socioeconomic History  . Marital status: Married    Spouse name: Not on file  . Number of children: Not on file  . Years of education: Not on file  . Highest education level: Not on file  Occupational History  . 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 Smoking status: Current Every Day Smoker    Packs/day: 0.50    Types: Cigarettes  . Smokeless tobacco: Never Used  Vaping Use  . Vaping Use: Never used  Substance and Sexual Activity  . Alcohol use: Yes    Comment: occ  . Drug use: Never  . Sexual activity: Not on file  Other Topics Concern  . Not on file  Social History Narrative  . Not on file   Social Determinants of Health   Financial Resource Strain:   . Difficulty of Paying Living Expenses:   Food Insecurity:   . Worried About Charity fundraiser in the Last Year:   . Arboriculturist in the Last Year:   Transportation Needs:   . Film/video editor (Medical):   Marland Kitchen Lack of Transportation (Non-Medical):   Physical Activity:   . Days of Exercise per Week:   . Minutes of Exercise per Session:   Stress:   . Feeling of Stress :   Social Connections:   . Frequency of Communication with Friends and Family:   .  Frequency of Social Gatherings with Friends and Family:   . Attends Religious Services:   . Active Member of Clubs or Organizations:   . Attends Archivist Meetings:   Marland Kitchen Marital Status:   Intimate Partner Violence:   . Fear of Current or Ex-Partner:   . Emotionally Abused:   Marland Kitchen Physically Abused:   . Sexually Abused:     Review of Systems: See HPI, otherwise negative ROS  Physical Exam: BP 106/65   Pulse 74   Temp 97.8 F (36.6 C) (Oral)   Resp 11   Ht 5' 6"  (1.676 m)   LMP 10/10/2019   SpO2 100%   BMI 21.76 kg/m  General:   Alert,  Well-developed, well-nourished, pleasant and cooperative in NAD s. Neck:  Supple; no masses or thyromegaly. No significant cervical adenopathy. Lungs:  Clear throughout to  auscultation.   No wheezes, crackles, or rhonchi. No acute distress. Heart:  Regular rate and rhythm; no murmurs, clicks, rubs,  or gallops. Abdomen: Non-distended, normal bowel sounds.  Soft and nontender without appreciable mass or hepatosplenomegaly.  Pulses:  Normal pulses noted. Extremities:  Without clubbing or edema.  Impression/Plan: 39 year old lady with rectal bleeding and iron deficiency anemia.  Here for colonoscopy.  The risks, benefits, limitations, alternatives and imponderables have been reviewed with the patient. Questions have been answered. All parties are agreeable.      Notice: This dictation was prepared with Dragon dictation along with smaller phrase technology. Any transcriptional errors that result from this process are unintentional and may not be corrected upon review.

## 2019-10-13 NOTE — Op Note (Signed)
[facility] Patient Name: Jill Pineda Procedure Date: 10/13/2019 12:28 PM MRN: [id number] Date of Birth: 11-Dec-1980 Attending MD: Gennette Pac , MD CSN: [id number] Age: 39 Admit Type: Outpatient Procedure:                Colonoscopy Indications:              Hematochezia, Iron deficiency anemia Providers:                Gennette Pac, MD, Loma Messing B. Patsy Lager, RN,                            Pandora Leiter, Technician Referring MD:              Medicines:                Midazolam 8 mg IV, Meperidine 50 mg IV Complications:            No immediate complications. Estimated Blood Loss:     Estimated blood loss: none. Procedure:                Pre-Anesthesia Assessment:                           - Prior to the procedure, a History and Physical                            was performed, and patient medications and                            allergies were reviewed. The patient's tolerance of                            previous anesthesia was also reviewed. The risks                            and benefits of the procedure and the sedation                            options and risks were discussed with the patient.                            All questions were answered, and informed consent                            was obtained. Prior Anticoagulants: The patient has                            taken no previous anticoagulant or antiplatelet                            agents. ASA Grade Assessment: II - A patient with                            mild systemic disease. After reviewing the risks  and benefits, the patient was deemed in                            satisfactory condition to undergo the procedure.                           After obtaining informed consent, the colonoscope                            was passed under direct vision. Throughout the                            procedure, the patient's blood pressure, pulse, and                             oxygen saturations were monitored continuously. The                            CF-[id number] ([id number]) scope was introduced through                            the anus and advanced to the the cecum, identified                            by appendiceal orifice and ileocecal valve. The                            colonoscopy was performed without difficulty. The                            patient tolerated the procedure well. The quality                            of the bowel preparation was adequate. The                            ileocecal valve, appendiceal orifice, and rectum                            were photographed. The entire colon was well                            visualized. Scope In: 12:46:11 PM Scope Out: 12:56:32 PM Scope Withdrawal Time: 0 hours 6 minutes 3 seconds  Total Procedure Duration: 0 hours 10 minutes 21 seconds  Findings:      Hemorrhoids were found on perianal exam.      Non-bleeding external and internal hemorrhoids were found during       retroflexion. The hemorrhoids were moderate, medium-sized and Grade II       (internal hemorrhoids that prolapse but reduce spontaneously).      The exam was otherwise without abnormality on direct and retroflexion       views. Impression:               - Hemorrhoids found on perianal exam.                           -  Non-bleeding external and internal hemorrhoids.                           - The examination was otherwise normal on direct                            and retroflexion views.                           - No specimens collected. I suspect hemorrhoidal                            bleeding. Celiac screen needs to be completed to                            wrap up evaluation of IDA. Moderate Sedation:      Moderate (conscious) sedation was administered by the endoscopy nurse       and supervised by the endoscopist. The following parameters were       monitored: oxygen saturation, heart rate, blood pressure,  respiratory       rate, EKG, adequacy of pulmonary ventilation, and response to care.       Total physician intraservice time was 19 minutes.      Moderate (conscious) sedation was administered by the endoscopy nurse       and supervised by the endoscopist. The following parameters were       monitored: oxygen saturation, heart rate, blood pressure, respiratory       rate, EKG, adequacy of pulmonary ventilation, and response to care.       Total physician intraservice time was 19 minutes. Recommendation:           - Patient has a contact number available for                            emergencies. The signs and symptoms of potential                            delayed complications were discussed with the                            patient. Return to normal activities tomorrow.                            Written discharge instructions were provided to the                            patient.                           - Resume previous diet.                           - Continue present medications.                           - Repeat colonoscopy in 10 years for surveillance.                           -  Return to GI office in 6 weeks. May be a                            reasonably good hemorrhoid banding candidate.                            Pamphlet on hemorrhoid banding provided. Patient                            encouraged to take her Linzess every day to                            adequately control constipation. Celiac serologies                            to be drawn prior to discharge today. Procedure Code(s):        --- Professional ---                           [phone], Colonoscopy, flexible; diagnostic, including                            collection of specimen(s) by brushing or washing,                            when performed (separate procedure)                           G0500, Moderate sedation services provided by the                            same physician or other qualified  health care                            professional performing a gastrointestinal                            endoscopic service that sedation supports,                            requiring the presence of an independent trained                            observer to assist in the monitoring of the                            patient's level of consciousness and physiological                            status; initial 15 minutes of intra-service time;                            patient age 44 years or older (additional time 69  be reported with [id number], as appropriate)                           G0500, Moderate sedation services provided by the                            same physician or other qualified health care                            professional performing a gastrointestinal                            endoscopic service that sedation supports,                            requiring the presence of an independent trained                            observer to assist in the monitoring of the                            patient's level of consciousness and physiological                            status; initial 15 minutes of intra-service time;                            patient age 73 years or older (additional time may                            be reported with [id number], as appropriate) Diagnosis Code(s):        --- Professional ---                           K64.1, Second degree hemorrhoids                           K92.1, Melena (includes Hematochezia)                           D50.9, Iron deficiency anemia, unspecified CPT copyright 2019 American Medical Association. All rights reserved. The codes documented in this report are preliminary and upon coder review may  be revised to meet current compliance requirements. Gerrit Friendsobert M. Rourk, MD Gennette Pacobert Michael Rourk, MD 10/13/2019 1:16:24 PM This report has been signed electronically. Number of Addenda: 0

## 2019-10-13 NOTE — Discharge Instructions (Signed)
Colonoscopy Discharge Instructions  Read the instructions outlined below and refer to this sheet in the next few weeks. These discharge instructions provide you with general information on caring for yourself after you leave the hospital. Your doctor may also give you specific instructions. While your treatment has been planned according to the most current medical practices available, unavoidable complications occasionally occur. If you have any problems or questions after discharge, call Dr. Jena Gauss at [phone]. ACTIVITY  You may resume your regular activity, but move at a slower pace for the next 24 hours.   Take frequent rest periods for the next 24 hours.   Walking will help get rid of the air and reduce the bloated feeling in your belly (abdomen).   No driving for 24 hours (because of the medicine (anesthesia) used during the test).    Do not sign any important legal documents or operate any machinery for 24 hours (because of the anesthesia used during the test).  NUTRITION  Drink plenty of fluids.   You may resume your normal diet as instructed by your doctor.   Begin with a light meal and progress to your normal diet. Heavy or fried foods are harder to digest and may make you feel sick to your stomach (nauseated).   Avoid alcoholic beverages for 24 hours or as instructed.  MEDICATIONS  You may resume your normal medications unless your doctor tells you otherwise.  WHAT YOU CAN EXPECT TODAY  Some feelings of bloating in the abdomen.   Passage of more gas than usual.   Spotting of blood in your stool or on the toilet paper.  IF YOU HAD POLYPS REMOVED DURING THE COLONOSCOPY:  No aspirin products for 7 days or as instructed.   No alcohol for 7 days or as instructed.   Eat a soft diet for the next 24 hours.  FINDING OUT THE RESULTS OF YOUR TEST Not all test results are available during your visit. If your test results are not back during the visit, make an appointment  with your caregiver to find out the results. Do not assume everything is normal if you have not heard from your caregiver or the medical facility. It is important for you to follow up on all of your test results.  SEEK IMMEDIATE MEDICAL ATTENTION IF:  You have more than a spotting of blood in your stool.   Your belly is swollen (abdominal distention).   You are nauseated or vomiting.   You have a temperature over 101.   You have abdominal pain or discomfort that is severe or gets worse throughout the day.    Your colon looked fine today.  You do have hemorrhoids-the most likely source of bleeding in the setting of constipation  You should have a repeat colonoscopy in 10 years for cancer screening  Information on hemorrhoids and constipation provided  Continue Linzess every day for management of constipation  You may or may not need hemorrhoid banding in the office in the near future  Pamphlet on hemorrhoid banding provided  At patient request, I called Melvenia Beam at [phone] -reviewed results  Draw blood for serum total IGA and TTG IGA to screen for celiac disease (was drawn day of covid testing)  Appointment with [place] in 6 weeks (AB)   Hemorrhoids Hemorrhoids are swollen veins in and around the rectum or anus. There are two types of hemorrhoids:  Internal hemorrhoids. These occur in the veins that are just inside the rectum. They may poke through  to the outside and become irritated and painful.  External hemorrhoids. These occur in the veins that are outside the anus and can be felt as a painful swelling or hard lump near the anus. Most hemorrhoids do not cause serious problems, and they can be managed with home treatments such as diet and lifestyle changes. If home treatments do not help the symptoms, procedures can be done to shrink or remove the hemorrhoids. What are the causes? This condition is caused by increased pressure in the anal area. This pressure may result  from various things, including:  Constipation.  Straining to have a bowel movement.  Diarrhea.  Pregnancy.  Obesity.  Sitting for long periods of time.  Heavy lifting or other activity that causes you to strain.  Anal sex.  Riding a bike for a long period of time. What are the signs or symptoms? Symptoms of this condition include:  Pain.  Anal itching or irritation.  Rectal bleeding.  Leakage of stool (feces).  Anal swelling.  One or more lumps around the anus. How is this diagnosed? This condition can often be diagnosed through a visual exam. Other exams or tests may also be done, such as:  An exam that involves feeling the rectal area with a gloved hand (digital rectal exam).  An exam of the anal canal that is done using a small tube (anoscope).  A blood test, if you have lost a significant amount of blood.  A test to look inside the colon using a flexible tube with a camera on the end (sigmoidoscopy or colonoscopy). How is this treated? This condition can usually be treated at home. However, various procedures may be done if dietary changes, lifestyle changes, and other home treatments do not help your symptoms. These procedures can help make the hemorrhoids smaller or remove them completely. Some of these procedures involve surgery, and others do not. Common procedures include:  Rubber band ligation. Rubber bands are placed at the base of the hemorrhoids to cut off their blood supply.  Sclerotherapy. Medicine is injected into the hemorrhoids to shrink them.  Infrared coagulation. A type of light energy is used to get rid of the hemorrhoids.  Hemorrhoidectomy surgery. The hemorrhoids are surgically removed, and the veins that supply them are tied off.  Stapled hemorrhoidopexy surgery. The surgeon staples the base of the hemorrhoid to the rectal wall. Follow these instructions at home: Eating and drinking   Eat foods that have a lot of fiber in them, such  as whole grains, beans, nuts, fruits, and vegetables.  Ask your health care provider about taking products that have added fiber (fiber supplements).  Reduce the amount of fat in your diet. You can do this by eating low-fat dairy products, eating less red meat, and avoiding processed foods.  Drink enough fluid to keep your urine pale yellow. Managing pain and swelling   Take warm sitz baths for 20 minutes, 3-4 times a day to ease pain and discomfort. You may do this in a bathtub or using a portable sitz bath that fits over the toilet.  If directed, apply ice to the affected area. Using ice packs between sitz baths may be helpful. ? Put ice in a plastic bag. ? Place a towel between your skin and the bag. ? Leave the ice on for 20 minutes, 2-3 times a day. General instructions  Take over-the-counter and prescription medicines only as told by your health care provider.  Use medicated creams or suppositories as told.  Get regular exercise. Ask your health care provider how much and what kind of exercise is best for you. In general, you should do moderate exercise for at least 30 minutes on most days of the week (150 minutes each week). This can include activities such as walking, biking, or yoga.  Go to the bathroom when you have the urge to have a bowel movement. Do not wait.  Avoid straining to have bowel movements.  Keep the anal area dry and clean. Use wet toilet paper or moist towelettes after a bowel movement.  Do not sit on the toilet for long periods of time. This increases blood pooling and pain.  Keep all follow-up visits as told by your health care provider. This is important. Contact a health care provider if you have:  Increasing pain and swelling that are not controlled by treatment or medicine.  Difficulty having a bowel movement, or you are unable to have a bowel movement.  Pain or inflammation outside the area of the hemorrhoids. Get help right away if you  have:  Uncontrolled bleeding from your rectum. Summary  Hemorrhoids are swollen veins in and around the rectum or anus.  Most hemorrhoids can be managed with home treatments such as diet and lifestyle changes.  Taking warm sitz baths can help ease pain and discomfort.  In severe cases, procedures or surgery can be done to shrink or remove the hemorrhoids. This information is not intended to replace advice given to you by your health care provider. Make sure you discuss any questions you have with your health care provider. Document Revised: 08/14/2018 Document Reviewed: 08/07/2017 Elsevier Patient Education  2020 [employer].  Constipation, Adult Constipation is when a person:  Poops (has a bowel movement) fewer times in a week than normal.  Has a hard time pooping.  Has poop that is dry, hard, or bigger than normal. Follow these instructions at home: Eating and drinking   Eat foods that have a lot of fiber, such as: ? Fresh fruits and vegetables. ? Whole grains. ? Beans.  Eat less of foods that are high in fat, low in fiber, or overly processed, such as: ? [place] fries. ? Hamburgers. ? Cookies. ? Candy. ? Soda.  Drink enough fluid to keep your pee (urine) clear or pale yellow. General instructions  Exercise regularly or as told by your doctor.  Go to the restroom when you feel like you need to poop. Do not hold it in.  Take over-the-counter and prescription medicines only as told by your doctor. These include any fiber supplements.  Do pelvic floor retraining exercises, such as: ? Doing deep breathing while relaxing your lower belly (abdomen). ? Relaxing your pelvic floor while pooping.  Watch your condition for any changes.  Keep all follow-up visits as told by your doctor. This is important. Contact a doctor if:  You have pain that gets worse.  You have a fever.  You have not pooped for 4 days.  You throw up (vomit).  You are not hungry.  You  lose weight.  You are bleeding from the anus.  You have thin, pencil-like poop (stool). Get help right away if:  You have a fever, and your symptoms suddenly get worse.  You leak poop or have blood in your poop.  Your belly feels hard or bigger than normal (is bloated).  You have very bad belly pain.  You feel dizzy or you faint. This information is not intended to replace advice given to  you by your health care provider. Make sure you discuss any questions you have with your health care provider. Document Revised: 02/28/2017 Document Reviewed: 09/06/2015 Elsevier Patient Education  2020 [employer]

## 2019-10-19 ENCOUNTER — Encounter (HOSPITAL_COMMUNITY): Payer: Self-pay | Admitting: Internal Medicine

## 2019-10-26 ENCOUNTER — Other Ambulatory Visit: Payer: Self-pay

## 2019-10-26 ENCOUNTER — Encounter: Payer: Self-pay | Admitting: Gastroenterology

## 2019-10-26 ENCOUNTER — Ambulatory Visit (INDEPENDENT_AMBULATORY_CARE_PROVIDER_SITE_OTHER): Payer: 59 | Admitting: Gastroenterology

## 2019-10-26 VITALS — BP 104/68 | HR 64 | Temp 97.0°F | Ht 66.0 in | Wt 134.4 lb

## 2019-10-26 DIAGNOSIS — K641 Second degree hemorrhoids: Secondary | ICD-10-CM | POA: Diagnosis not present

## 2019-10-26 MED ORDER — LUBIPROSTONE 24 MCG PO CAPS: 24.0000 ug | ORAL_CAPSULE | Freq: Two times a day (BID) | ORAL | 1 refills | Status: DC

## 2019-10-26 NOTE — Patient Instructions (Signed)
I have given Linzess samples just to get you started again. Per insurance, we won't be able to stay on this unless you try Amitiza first. I have sent in Amitiza gelcaps to take twice a day with food. Make sure to take with food to avoid nausea. You can start out taking it once per day at breakfast.   We will see you in 2-3 weeks for additional banding!  Avoid straining, limiting toilet time to 2-3 minutes if possible.  I enjoyed seeing you again today! As you know, I value our relationship and want to provide genuine, compassionate, and quality care. I welcome your feedback. If you receive a survey regarding your visit,  I greatly appreciate you taking time to fill this out. See you next time!  Gelene Mink, PhD, ANP-BC [facility]

## 2019-10-26 NOTE — Progress Notes (Signed)
[facility] Banding Note:    39 year old female with history of IDA now improved, constipation, recent colonoscopy on file with hemorrhoids, both external and internal. Celiac serologies negative. Here today to discuss banding. Insurance company had denied Linzess and recommended trying/failing Amitiza first. This was sent into the pharmacy. She did not know this was to be picked up. Linzess has worked well.   The patient presents with symptomatic grade 1-2 hemorrhoids, unresponsive to maximal medical therapy, requesting rubber band ligation of her hemorrhoidal disease. All risks, benefits, and alternative forms of therapy were described and informed consent was obtained.  The decision was made to band the left lateral internal hemorrhoid, and the [facility] System was used to perform band ligation without complication. Digital anorectal examination was then performed to assure proper positioning of the band, and no adjustment was required. The patient was discharged home without pain or other issues. Dietary and behavioral recommendations were given and (if necessary prescriptions were given), along with follow-up instructions. She will trial Amitiza 24 mcg po once to BID for constipation. Until she can pick this up, I have given her additional Linzess samples. She will need to try/fail Amitiza and Motegrity per insurance. Linzess had worked well.  The patient will return in 2-3 weeks for followup and possible additional banding as required.  No complications were encountered and the patient tolerated the procedure well.   Gelene Mink, PhD, ANP-BC [facility]

## 2019-12-10 ENCOUNTER — Encounter: Payer: 59 | Admitting: Gastroenterology

## 2020-01-14 ENCOUNTER — Encounter: Payer: 59 | Admitting: Gastroenterology

## 2020-01-20 ENCOUNTER — Encounter: Payer: 59 | Admitting: Gastroenterology

## 2020-01-20 NOTE — Progress Notes (Deleted)
history of IDA now improved, constipation, recent colonoscopy on file with hemorrhoids, both external and internal. Celiac serologies negative. First banding in July 2021 of left lateral.

## 2020-06-22 ENCOUNTER — Encounter: Payer: Self-pay | Admitting: Plastic Surgery

## 2020-06-22 ENCOUNTER — Ambulatory Visit (INDEPENDENT_AMBULATORY_CARE_PROVIDER_SITE_OTHER): Payer: BLUE CROSS/BLUE SHIELD | Admitting: Plastic Surgery

## 2020-06-22 ENCOUNTER — Other Ambulatory Visit: Payer: Self-pay

## 2020-06-22 VITALS — BP 110/76 | HR 70 | Ht 66.0 in | Wt 133.8 lb

## 2020-06-22 DIAGNOSIS — M546 Pain in thoracic spine: Secondary | ICD-10-CM | POA: Diagnosis not present

## 2020-06-22 DIAGNOSIS — M545 Low back pain, unspecified: Secondary | ICD-10-CM

## 2020-06-22 DIAGNOSIS — M4004 Postural kyphosis, thoracic region: Secondary | ICD-10-CM | POA: Diagnosis not present

## 2020-06-22 DIAGNOSIS — N62 Hypertrophy of breast: Secondary | ICD-10-CM

## 2020-06-22 NOTE — Progress Notes (Signed)
Referring Provider Kirstie Peri, MD [address] [place],  [place] [address]   CC:  Chief Complaint  Patient presents with  . Advice Only      Jill Pineda is an 40 y.o. female.  HPI: Patient presents to discuss breast reduction.  She had years of back pain, neck pain and shoulder grooving related to her large breasts.  She tried over-the-counter medications, warm packs, cold packs and supportive bras with little relief.  She is currently a double D and wants to be a B or C cup.  Her mother had breast cancer.  She has not had any previous breast procedures or biopsies.  She does smoke cigarettes but is not a diabetic.  [facility] to [facility].  She has had a mammogram before that was normal.  Allergies  Allergen Reactions  . Celebrex [Celecoxib]     Excessive sleepiness   . Citalopram     Excessive sleepiness     Outpatient Encounter Medications as of 06/22/2020  Medication Sig  . ALPRAZolam (XANAX) 0.25 MG tablet Take 0.25 mg by mouth daily as needed for anxiety.  . [DISCONTINUED] acetaminophen (TYLENOL) 500 MG tablet Take 500 mg by mouth every 8 (eight) hours as needed for moderate pain or headache.  . [DISCONTINUED] hydrocortisone (ANUSOL-HC) 2.5 % rectal cream Place 1 application rectally 2 (two) times daily. (Patient taking differently: Place 1 application rectally 2 (two) times daily as needed (discomfort). )  . [DISCONTINUED] linaclotide (LINZESS) 290 MCG CAPS capsule Take 1 capsule (290 mcg total) by mouth daily before breakfast. (Patient not taking: Reported on 10/26/2019)  . [DISCONTINUED] loratadine (CLARITIN) 10 MG tablet Take 10 mg by mouth daily as needed for allergies.  . [DISCONTINUED] lubiprostone (AMITIZA) 24 MCG capsule Take 1 capsule (24 mcg total) by mouth 2 (two) times daily with a meal.   No facility-administered encounter medications on file as of 06/22/2020.     Past Medical History:  Diagnosis Date  . Anemia   . Constipation     Past Surgical  History:  Procedure Laterality Date  . CESAREAN SECTION     X 2  . COLONOSCOPY N/A 10/13/2019   hemorrhoids, both external and internal.  . ELBOW SURGERY    . WISDOM TOOTH EXTRACTION      Family History  Problem Relation Age of Onset  . Healthy Mother   . Healthy Father     Social History   Social History Narrative  . Not on file     Review of Systems General: Denies fevers, chills, weight loss CV: Denies chest pain, shortness of breath, palpitations  Physical Exam Vitals with BMI 06/22/2020 10/26/2019 10/13/2019  Height 5\' 6"  5\' 6"  -  Weight 133 lbs 13 oz 134 lbs 6 oz -  BMI 21.61 21.7 -  Systolic 110 104  Diastolic 76 68 55  Pulse 70 64 69    General:  No acute distress,  Alert and oriented, Non-Toxic, Normal speech and affect Breast: She has grade 3 ptosis.  Sternal notch to nipple is 28 cm bilaterally.  Nipple to fold is 12 cm bilaterally.  I do not see any obvious scars or masses.  Assessment/Plan The patient has bilateral symptomatic macromastia.  She is a good candidate for a breast reduction.  She is interested in pursuing surgical treatment.  She has tried supportive garments and fitted bras with no relief.  The details of breast reduction surgery were discussed.  I explained the procedure in  detail along the with the expected scars.  The risks were discussed in detail and include bleeding, infection, damage to surrounding structures, need for additional procedures, nipple loss, change in nipple sensation, persistent pain, contour irregularities and asymmetries.  I explained that breast feeding is often not possible after breast reduction surgery.  We discussed the expected postoperative course with an overall recovery period of about 1 month.  She demonstrated full understanding of all risks.  We discussed her personal risk factors that include cigarette use.  I explained she would need to stop smoking for 4 to 6 weeks ahead of surgery.  I explained the additional  wound healing risks associated with tobacco use.  I anticipate approximately 350g of tissue removed from each side.   [name] 06/22/2020, 8:58 AM

## 2020-08-15 ENCOUNTER — Ambulatory Visit (INDEPENDENT_AMBULATORY_CARE_PROVIDER_SITE_OTHER): Payer: BLUE CROSS/BLUE SHIELD | Admitting: Surgical

## 2020-08-15 ENCOUNTER — Other Ambulatory Visit: Payer: Self-pay

## 2020-08-15 ENCOUNTER — Encounter: Payer: Self-pay | Admitting: Surgical

## 2020-08-15 VITALS — BP 110/71 | HR 79 | Ht 65.0 in | Wt 137.4 lb

## 2020-08-15 DIAGNOSIS — M4004 Postural kyphosis, thoracic region: Secondary | ICD-10-CM

## 2020-08-15 DIAGNOSIS — M545 Low back pain, unspecified: Secondary | ICD-10-CM

## 2020-08-15 DIAGNOSIS — N62 Hypertrophy of breast: Secondary | ICD-10-CM

## 2020-08-15 DIAGNOSIS — M546 Pain in thoracic spine: Secondary | ICD-10-CM

## 2020-08-15 DIAGNOSIS — Z719 Counseling, unspecified: Secondary | ICD-10-CM

## 2020-08-15 NOTE — H&P (View-Only) (Signed)
Patient ID: Jill Pineda, female    DOB: 17-Oct-1980, 39 y.o.   MRN: [id number]  Chief Complaint  Patient presents with  . Pre-op Exam      ICD-10-CM   1. Macromastia  N62 Nicotine/cotinine metabolites  2. Back pain of thoracolumbar region  M54.50    M54.6   3. 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  female  with a history of macromastia.  She presents for preoperative evaluation for upcoming procedure, Bilateral Breast Reduction, scheduled for 09/05/20 with Dr.  Arita Miss  The patient has not had problems with anesthesia. No history of DVT/PE.  No family history of DVT/PE.  No family or personal history of bleeding or clotting disorders.  Patient is not currently taking any blood thinners.  No history of CVA/MI.   Summary of Previous Visit: She is currently a double D and wants to be a B or C cup.  Her mother had breast cancer.  She does smoke cigarettes but is not a diabetic.  She has had a mammogram before that was normal.  No previous breast procedures or biopsies.  Estimated excess breast tissue to be removed at time of surgery: 350 on the left and 350 on the right.  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  PMH Significant for: Iron deficiency anemia  She reports she stopped smoking 5 days ago.  She is aware that she needs a nicotine test prior to surgery. She reports today she had a mammogram in January 2020 and January 2021 which were both normal and she has no history of malignancy being found on mammograms.  Past Medical History: Allergies: Allergies  Allergen Reactions  . Celebrex [Celecoxib]     Excessive sleepiness   . Citalopram     Excessive sleepiness     Current Medications:  Current Outpatient Medications:  .  ALPRAZolam (XANAX) 0.25 MG tablet, Take 0.25 mg by mouth daily as needed for anxiety., Disp: , Rfl:   Past Medical Problems: Past  Medical History:  Diagnosis Date  . Anemia   . Constipation     Past Surgical History: Past Surgical History:  Procedure Laterality Date  . CESAREAN SECTION     X 2  . COLONOSCOPY N/A 10/13/2019   hemorrhoids, both external and internal.  . ELBOW SURGERY    . WISDOM TOOTH EXTRACTION      Social History: Social History   Socioeconomic History  . Marital status: Married    Spouse name: Not on file  . Number of children: Not on file  . Years of education: Not on file  . Highest education level: Not on file  Occupational History  . 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 Smoking status: Former Smoker    Packs/day: 0.50    Types: Cigarettes    Quit date: 08/08/2020    Years since quitting: 0.0  . Smokeless tobacco: Never Used  Vaping Use  . Vaping Use: Never used  Substance and Sexual Activity  . Alcohol use: Yes    Comment: occ  . Drug use: Never  . Sexual activity: Not on file  Other Topics Concern  . Not on file  Social History Narrative  . Not on file   Social Determinants of Health   Financial Resource Strain: Not on file  Food Insecurity: Not on file  Transportation Needs:  Not on file  Physical Activity: Not on file  Stress: Not on file  Social Connections: Not on file  Intimate Partner Violence: Not on file    Family History: Family History  Problem Relation Age of Onset  . Healthy Mother   . Healthy Father     Review of Systems: Review of Systems  Constitutional: Negative.   Respiratory: Negative.   Cardiovascular: Negative.   Gastrointestinal: Negative.   Neurological: Negative.     Physical Exam: Vital Signs BP 110/71 (BP Location: Left Arm, Patient Position: Sitting, Cuff Size: Normal)   Pulse 79   Ht 5\' 5"  (1.651 m)   Wt 137 lb 6.4 oz (62.3 kg)   LMP 08/05/2020 (Exact Date)   SpO2 97%   BMI 22.86 kg/m   Physical Exam Constitutional:      General: Not in acute distress.    Appearance: Normal appearance. Not ill-appearing.   HENT:     Head: Normocephalic and atraumatic.  Eyes:     Pupils: Pupils are equal, round Neck:     Musculoskeletal: Normal range of motion.  Cardiovascular:     Rate and Rhythm: Normal rate    Pulses: Normal pulses.  Pulmonary:     Effort: Pulmonary effort is normal. No respiratory distress.  Abdominal:     General: Abdomen is flat. There is no distension.  Musculoskeletal: Normal range of motion.  Skin:    General: Skin is warm and dry.     Findings: No erythema or rash.  Neurological:     General: No focal deficit present.     Mental Status: Alert and oriented to person, place, and time. Mental status is at baseline.     Motor: No weakness.  Psychiatric:        Mood and Affect: Mood normal.        Behavior: Behavior normal.    Assessment/Plan: The patient is scheduled for bilateral breast reduction with Dr. 10/05/2020.  Risks, benefits, and alternatives of procedure discussed, questions answered and consent obtained.    Smoking Status: Quit smoking 5 days ago; Counseling Given?  Discussed with patient increased risk of postoperative complications including but not limited to nipple areolar necrosis Patient provided with nicotine test which she is required to complete prior to surgery.  She is aware that if this returns positive her surgery will be canceled.   Last Mammogram: 04/08/2018; Results: Negative, recommend screening mammogram at 41  Caprini Score: 2, low; Risk Factors include: length of planned surgery. Recommendation for mechanical and pharmacological prophylaxis while hospitalized. Encourage early ambulation.   Pictures obtained:[email or web address]   Post-op Rx sent to pharmacy: Norco, Zofran to be sent to pharmacy once patient's nicotine test has been confirmed negative  Patient was provided with the breast reduction and General Surgical Risk consent document and Pain Medication Agreement prior to their appointment.  They had adequate time to read through the risk consent  documents and Pain Medication Agreement. We also discussed them in person together during this preop appointment. All of their questions were answered to their satisfaction.  Recommended calling if they have any further questions.  Risk consent form and Pain Medication Agreement to be scanned into patient's chart.  The risk that can be encountered with breast reduction were discussed and include the following but not limited to these:  Breast asymmetry, fluid accumulation, firmness of the breast, inability to breast feed, loss of nipple or areola, skin loss, decrease or no nipple sensation, fat necrosis of the breast tissue, bleeding,  infection, healing delay.  There are risks of anesthesia, changes to skin sensation and injury to nerves or blood vessels.  The muscle can be temporarily or permanently injured.  You may have an allergic reaction to tape, suture, glue, blood products which can result in skin discoloration, swelling, pain, skin lesions, poor healing.  Any of these can lead to the need for revisonal surgery or stage procedures.  A reduction has potential to interfere with diagnostic procedures.  Nipple or breast piercing can increase risks of infection.  This procedure is best done when the breast is fully developed.  Changes in the breast will continue to occur over time.  Pregnancy can alter the outcomes of previous breast reduction surgery, weight gain and weigh loss can also effect the long term appearance.   Electronically signed by: Kermit Balo Mairi Stagliano, PA-C 08/15/2020 2:25 PM

## 2020-08-15 NOTE — Progress Notes (Signed)
Patient ID: Jill Pineda, female    DOB: 17-Oct-1980, 39 y.o.   MRN: [id number]  Chief Complaint  Patient presents with  . Pre-op Exam      ICD-10-CM   1. Macromastia  N62 Nicotine/cotinine metabolites  2. Back pain of thoracolumbar region  M54.50    M54.6   3. 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  female  with a history of macromastia.  She presents for preoperative evaluation for upcoming procedure, Bilateral Breast Reduction, scheduled for 09/05/20 with Dr.  Arita Miss  The patient has not had problems with anesthesia. No history of DVT/PE.  No family history of DVT/PE.  No family or personal history of bleeding or clotting disorders.  Patient is not currently taking any blood thinners.  No history of CVA/MI.   Summary of Previous Visit: She is currently a double D and wants to be a B or C cup.  Her mother had breast cancer.  She does smoke cigarettes but is not a diabetic.  She has had a mammogram before that was normal.  No previous breast procedures or biopsies.  Estimated excess breast tissue to be removed at time of surgery: 350 on the left and 350 on the right.  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  PMH Significant for: Iron deficiency anemia  She reports she stopped smoking 5 days ago.  She is aware that she needs a nicotine test prior to surgery. She reports today she had a mammogram in January 2020 and January 2021 which were both normal and she has no history of malignancy being found on mammograms.  Past Medical History: Allergies: Allergies  Allergen Reactions  . Celebrex [Celecoxib]     Excessive sleepiness   . Citalopram     Excessive sleepiness     Current Medications:  Current Outpatient Medications:  .  ALPRAZolam (XANAX) 0.25 MG tablet, Take 0.25 mg by mouth daily as needed for anxiety., Disp: , Rfl:   Past Medical Problems: Past  Medical History:  Diagnosis Date  . Anemia   . Constipation     Past Surgical History: Past Surgical History:  Procedure Laterality Date  . CESAREAN SECTION     X 2  . COLONOSCOPY N/A 10/13/2019   hemorrhoids, both external and internal.  . ELBOW SURGERY    . WISDOM TOOTH EXTRACTION      Social History: Social History   Socioeconomic History  . Marital status: Married    Spouse name: Not on file  . Number of children: Not on file  . Years of education: Not on file  . Highest education level: Not on file  Occupational History  . 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 Smoking status: Former Smoker    Packs/day: 0.50    Types: Cigarettes    Quit date: 08/08/2020    Years since quitting: 0.0  . Smokeless tobacco: Never Used  Vaping Use  . Vaping Use: Never used  Substance and Sexual Activity  . Alcohol use: Yes    Comment: occ  . Drug use: Never  . Sexual activity: Not on file  Other Topics Concern  . Not on file  Social History Narrative  . Not on file   Social Determinants of Health   Financial Resource Strain: Not on file  Food Insecurity: Not on file  Transportation Needs:  Not on file  Physical Activity: Not on file  Stress: Not on file  Social Connections: Not on file  Intimate Partner Violence: Not on file    Family History: Family History  Problem Relation Age of Onset  . Healthy Mother   . Healthy Father     Review of Systems: Review of Systems  Constitutional: Negative.   Respiratory: Negative.   Cardiovascular: Negative.   Gastrointestinal: Negative.   Neurological: Negative.     Physical Exam: Vital Signs BP 110/71 (BP Location: Left Arm, Patient Position: Sitting, Cuff Size: Normal)   Pulse 79   Ht 5\' 5"  (1.651 m)   Wt 137 lb 6.4 oz (62.3 kg)   LMP 08/05/2020 (Exact Date)   SpO2 97%   BMI 22.86 kg/m   Physical Exam Constitutional:      General: Not in acute distress.    Appearance: Normal appearance. Not ill-appearing.   HENT:     Head: Normocephalic and atraumatic.  Eyes:     Pupils: Pupils are equal, round Neck:     Musculoskeletal: Normal range of motion.  Cardiovascular:     Rate and Rhythm: Normal rate    Pulses: Normal pulses.  Pulmonary:     Effort: Pulmonary effort is normal. No respiratory distress.  Abdominal:     General: Abdomen is flat. There is no distension.  Musculoskeletal: Normal range of motion.  Skin:    General: Skin is warm and dry.     Findings: No erythema or rash.  Neurological:     General: No focal deficit present.     Mental Status: Alert and oriented to person, place, and time. Mental status is at baseline.     Motor: No weakness.  Psychiatric:        Mood and Affect: Mood normal.        Behavior: Behavior normal.    Assessment/Plan: The patient is scheduled for bilateral breast reduction with Dr. 10/05/2020.  Risks, benefits, and alternatives of procedure discussed, questions answered and consent obtained.    Smoking Status: Quit smoking 5 days ago; Counseling Given?  Discussed with patient increased risk of postoperative complications including but not limited to nipple areolar necrosis Patient provided with nicotine test which she is required to complete prior to surgery.  She is aware that if this returns positive her surgery will be canceled.   Last Mammogram: 04/08/2018; Results: Negative, recommend screening mammogram at 41  Caprini Score: 2, low; Risk Factors include: length of planned surgery. Recommendation for mechanical and pharmacological prophylaxis while hospitalized. Encourage early ambulation.   Pictures obtained:[email or web address]   Post-op Rx sent to pharmacy: Norco, Zofran to be sent to pharmacy once patient's nicotine test has been confirmed negative  Patient was provided with the breast reduction and General Surgical Risk consent document and Pain Medication Agreement prior to their appointment.  They had adequate time to read through the risk consent  documents and Pain Medication Agreement. We also discussed them in person together during this preop appointment. All of their questions were answered to their satisfaction.  Recommended calling if they have any further questions.  Risk consent form and Pain Medication Agreement to be scanned into patient's chart.  The risk that can be encountered with breast reduction were discussed and include the following but not limited to these:  Breast asymmetry, fluid accumulation, firmness of the breast, inability to breast feed, loss of nipple or areola, skin loss, decrease or no nipple sensation, fat necrosis of the breast tissue, bleeding,  infection, healing delay.  There are risks of anesthesia, changes to skin sensation and injury to nerves or blood vessels.  The muscle can be temporarily or permanently injured.  You may have an allergic reaction to tape, suture, glue, blood products which can result in skin discoloration, swelling, pain, skin lesions, poor healing.  Any of these can lead to the need for revisonal surgery or stage procedures.  A reduction has potential to interfere with diagnostic procedures.  Nipple or breast piercing can increase risks of infection.  This procedure is best done when the breast is fully developed.  Changes in the breast will continue to occur over time.  Pregnancy can alter the outcomes of previous breast reduction surgery, weight gain and weigh loss can also effect the long term appearance.   Electronically signed by: Kermit Balo Draven Laine, PA-C 08/15/2020 2:25 PM

## 2020-08-30 ENCOUNTER — Other Ambulatory Visit: Payer: Self-pay

## 2020-08-30 ENCOUNTER — Encounter (HOSPITAL_BASED_OUTPATIENT_CLINIC_OR_DEPARTMENT_OTHER): Payer: Self-pay | Admitting: Plastic Surgery

## 2020-09-01 ENCOUNTER — Other Ambulatory Visit (HOSPITAL_COMMUNITY): Payer: BLUE CROSS/BLUE SHIELD

## 2020-09-04 ENCOUNTER — Telehealth: Payer: Self-pay

## 2020-09-04 NOTE — Telephone Encounter (Signed)
Patient does not need to be rescheduled. We are ok to proceed, per Dr. Arita Miss. Nicotine test was performed, per [facility].

## 2020-09-04 NOTE — Telephone Encounter (Signed)
Patient called to see if we have received the results for her nicotine test.  Please call.

## 2020-09-04 NOTE — Telephone Encounter (Signed)
Called [facility], verified with Dermaius that the nicotine results are not in and could take until this coming Friday. They are behind in the lab department. Patient had her test performed on 08/24/20.

## 2020-09-05 ENCOUNTER — Other Ambulatory Visit: Payer: Self-pay

## 2020-09-05 ENCOUNTER — Ambulatory Visit (HOSPITAL_BASED_OUTPATIENT_CLINIC_OR_DEPARTMENT_OTHER)
Admission: RE | Admit: 2020-09-05 | Discharge: 2020-09-05 | Disposition: A | Discharge status: Home / Self Care | Payer: BLUE CROSS/BLUE SHIELD | Attending: Plastic Surgery | Admitting: Plastic Surgery

## 2020-09-05 ENCOUNTER — Encounter (HOSPITAL_BASED_OUTPATIENT_CLINIC_OR_DEPARTMENT_OTHER)
Admission: RE | Disposition: A | Discharge status: Home / Self Care | Payer: Self-pay | Source: Home / Self Care | Attending: Plastic Surgery

## 2020-09-05 ENCOUNTER — Encounter (HOSPITAL_BASED_OUTPATIENT_CLINIC_OR_DEPARTMENT_OTHER): Payer: Self-pay | Admitting: Plastic Surgery

## 2020-09-05 ENCOUNTER — Ambulatory Visit (HOSPITAL_BASED_OUTPATIENT_CLINIC_OR_DEPARTMENT_OTHER): Payer: BLUE CROSS/BLUE SHIELD | Admitting: Certified Registered"

## 2020-09-05 DIAGNOSIS — N62 Hypertrophy of breast: Secondary | ICD-10-CM | POA: Insufficient documentation

## 2020-09-05 DIAGNOSIS — M546 Pain in thoracic spine: Secondary | ICD-10-CM

## 2020-09-05 DIAGNOSIS — M4004 Postural kyphosis, thoracic region: Secondary | ICD-10-CM

## 2020-09-05 DIAGNOSIS — N6489 Other specified disorders of breast: Secondary | ICD-10-CM | POA: Diagnosis not present

## 2020-09-05 DIAGNOSIS — Z87891 Personal history of nicotine dependence: Secondary | ICD-10-CM | POA: Diagnosis not present

## 2020-09-05 DIAGNOSIS — Z886 Allergy status to analgesic agent status: Secondary | ICD-10-CM | POA: Insufficient documentation

## 2020-09-05 DIAGNOSIS — Z888 Allergy status to other drugs, medicaments and biological substances status: Secondary | ICD-10-CM | POA: Diagnosis not present

## 2020-09-05 DIAGNOSIS — M545 Low back pain, unspecified: Secondary | ICD-10-CM

## 2020-09-05 HISTORY — PX: BREAST REDUCTION SURGERY: SHX8

## 2020-09-05 HISTORY — DX: Anxiety disorder, unspecified: F41.9

## 2020-09-05 LAB — POCT PREGNANCY, URINE: Preg Test, Ur: NEGATIVE

## 2020-09-05 SURGERY — MAMMOPLASTY, REDUCTION
Anesthesia: General | Site: Breast | Laterality: Bilateral

## 2020-09-05 MED ORDER — FENTANYL CITRATE (PF) 100 MCG/2ML IJ SOLN
INTRAMUSCULAR | Status: DC | PRN
  Administered 2020-09-05 (×2): 50 ug via INTRAVENOUS
  Administered 2020-09-05 (×2): 25 ug via INTRAVENOUS

## 2020-09-05 MED ORDER — DEXAMETHASONE SODIUM PHOSPHATE 4 MG/ML IJ SOLN
INTRAMUSCULAR | Status: DC | PRN
  Administered 2020-09-05: 6 mg via INTRAVENOUS

## 2020-09-05 MED ORDER — CEFAZOLIN SODIUM-DEXTROSE 2-4 GM/100ML-% IV SOLN
2.0000 g | INTRAVENOUS | Status: AC
  Administered 2020-09-05: 2 g via INTRAVENOUS

## 2020-09-05 MED ORDER — AMISULPRIDE (ANTIEMETIC) 5 MG/2ML IV SOLN
10.0000 mg | Freq: Once | INTRAVENOUS | Status: AC
  Administered 2020-09-05: 10 mg via INTRAVENOUS

## 2020-09-05 MED ORDER — FENTANYL CITRATE (PF) 100 MCG/2ML IJ SOLN
INTRAMUSCULAR | Status: AC
  Filled 2020-09-05: qty 2

## 2020-09-05 MED ORDER — ONDANSETRON HCL 4 MG/2ML IJ SOLN
INTRAMUSCULAR | Status: AC
  Filled 2020-09-05: qty 2

## 2020-09-05 MED ORDER — LIDOCAINE HCL (PF) 2 % IJ SOLN
INTRAMUSCULAR | Status: AC
  Filled 2020-09-05: qty 5

## 2020-09-05 MED ORDER — AMISULPRIDE (ANTIEMETIC) 5 MG/2ML IV SOLN
INTRAVENOUS | Status: AC
  Filled 2020-09-05: qty 4

## 2020-09-05 MED ORDER — ONDANSETRON HCL 4 MG PO TABS: 4.0000 mg | ORAL_TABLET | Freq: Three times a day (TID) | ORAL | 0 refills | Status: DC | PRN

## 2020-09-05 MED ORDER — LIDOCAINE 2% (20 MG/ML) 5 ML SYRINGE
INTRAMUSCULAR | Status: DC | PRN
  Administered 2020-09-05: 60 mg via INTRAVENOUS

## 2020-09-05 MED ORDER — MIDAZOLAM HCL 2 MG/2ML IJ SOLN
INTRAMUSCULAR | Status: AC
  Filled 2020-09-05: qty 2

## 2020-09-05 MED ORDER — PROPOFOL 10 MG/ML IV BOLUS
INTRAVENOUS | Status: AC
  Filled 2020-09-05: qty 20

## 2020-09-05 MED ORDER — ACETAMINOPHEN 10 MG/ML IV SOLN: 1000.0000 mg | Freq: Once | INTRAVENOUS | Status: DC | PRN

## 2020-09-05 MED ORDER — ONDANSETRON HCL 4 MG/2ML IJ SOLN
INTRAMUSCULAR | Status: DC | PRN
  Administered 2020-09-05: 4 mg via INTRAVENOUS

## 2020-09-05 MED ORDER — LACTATED RINGERS IV SOLN: INTRAVENOUS | Status: DC | PRN

## 2020-09-05 MED ORDER — CEFAZOLIN SODIUM-DEXTROSE 2-4 GM/100ML-% IV SOLN
INTRAVENOUS | Status: AC
  Filled 2020-09-05: qty 100

## 2020-09-05 MED ORDER — HYDROCODONE-ACETAMINOPHEN 5-325 MG PO TABS: 1.0000 | ORAL_TABLET | Freq: Four times a day (QID) | ORAL | 0 refills | Status: DC | PRN

## 2020-09-05 MED ORDER — MIDAZOLAM HCL 5 MG/5ML IJ SOLN
INTRAMUSCULAR | Status: DC | PRN
  Administered 2020-09-05: 2 mg via INTRAVENOUS

## 2020-09-05 MED ORDER — PROMETHAZINE HCL 25 MG/ML IJ SOLN
6.2500 mg | INTRAMUSCULAR | Status: DC | PRN
  Administered 2020-09-05: 6.25 mg via INTRAVENOUS

## 2020-09-05 MED ORDER — LACTATED RINGERS IV SOLN: INTRAVENOUS | Status: DC

## 2020-09-05 MED ORDER — 0.9 % SODIUM CHLORIDE (POUR BTL) OPTIME
TOPICAL | Status: DC | PRN
  Administered 2020-09-05: 1000 mL

## 2020-09-05 MED ORDER — PROMETHAZINE HCL 25 MG/ML IJ SOLN
INTRAMUSCULAR | Status: AC
  Filled 2020-09-05: qty 1

## 2020-09-05 MED ORDER — PROPOFOL 10 MG/ML IV BOLUS
INTRAVENOUS | Status: DC | PRN
  Administered 2020-09-05: 130 mg via INTRAVENOUS

## 2020-09-05 MED ORDER — FENTANYL CITRATE (PF) 100 MCG/2ML IJ SOLN: 25.0000 ug | INTRAMUSCULAR | Status: DC | PRN

## 2020-09-05 MED ORDER — DEXAMETHASONE SODIUM PHOSPHATE 10 MG/ML IJ SOLN
INTRAMUSCULAR | Status: AC
  Filled 2020-09-05: qty 1

## 2020-09-05 SURGICAL SUPPLY — 71 items
APL PRP STRL LF DISP 70% ISPRP (MISCELLANEOUS) ×2
APL SKNCLS STERI-STRIP NONHPOA (GAUZE/BANDAGES/DRESSINGS) ×2
BAG DECANTER FOR FLEXI CONT (MISCELLANEOUS) IMPLANT
BENZOIN TINCTURE PRP APPL 2/3 (GAUZE/BANDAGES/DRESSINGS) ×6 IMPLANT
BLADE SURG 10 STRL SS (BLADE) ×6 IMPLANT
BLADE SURG 15 STRL LF DISP TIS (BLADE) IMPLANT
BLADE SURG 15 STRL SS (BLADE)
BNDG CMPR MED 10X6 ELC LF (GAUZE/BANDAGES/DRESSINGS)
BNDG ELASTIC 6X10 VLCR STRL LF (GAUZE/BANDAGES/DRESSINGS) IMPLANT
BNDG ELASTIC 6X5.8 VLCR STR LF (GAUZE/BANDAGES/DRESSINGS) ×6 IMPLANT
CANISTER SUCT 1200ML W/VALVE (MISCELLANEOUS) ×3 IMPLANT
CHLORAPREP W/TINT 26 (MISCELLANEOUS) ×6 IMPLANT
CLIP VESOCCLUDE MED 6/CT (CLIP) IMPLANT
COVER BACK TABLE 60X90IN (DRAPES) ×3 IMPLANT
COVER MAYO STAND STRL (DRAPES) ×3 IMPLANT
COVER WAND RF STERILE (DRAPES) IMPLANT
DECANTER SPIKE VIAL GLASS SM (MISCELLANEOUS) IMPLANT
DRAIN CHANNEL 15F RND FF W/TCR (WOUND CARE) IMPLANT
DRAIN PENROSE 1/2X12 LTX STRL (WOUND CARE) IMPLANT
DRAPE LAPAROSCOPIC ABDOMINAL (DRAPES) ×3 IMPLANT
DRAPE UTILITY XL STRL (DRAPES) ×3 IMPLANT
DRSG PAD ABDOMINAL 8X10 ST (GAUZE/BANDAGES/DRESSINGS) ×12 IMPLANT
ELECT REM PT RETURN 9FT ADLT (ELECTROSURGICAL) ×3
ELECTRODE REM PT RTRN 9FT ADLT (ELECTROSURGICAL) ×1 IMPLANT
EVACUATOR SILICONE 100CC (DRAIN) IMPLANT
GAUZE SPONGE 4X4 12PLY STRL (GAUZE/BANDAGES/DRESSINGS) ×3 IMPLANT
GAUZE XEROFORM 5X9 LF (GAUZE/BANDAGES/DRESSINGS) IMPLANT
GLOVE SRG 8 PF TXTR STRL LF DI (GLOVE) ×1 IMPLANT
GLOVE SURG ENC MOIS LTX SZ6.5 (GLOVE) ×3 IMPLANT
GLOVE SURG ENC MOIS LTX SZ7.5 (GLOVE) IMPLANT
GLOVE SURG ENC TEXT LTX SZ7.5 (GLOVE) ×3 IMPLANT
GLOVE SURG LTX SZ6.5 (GLOVE) IMPLANT
GLOVE SURG UNDER POLY LF SZ8 (GLOVE) ×3
GOWN STRL REUS W/ TWL LRG LVL3 (GOWN DISPOSABLE) ×3 IMPLANT
GOWN STRL REUS W/TWL LRG LVL3 (GOWN DISPOSABLE) ×9
MARKER SKIN DUAL TIP RULER LAB (MISCELLANEOUS) IMPLANT
NDL SAFETY ECLIPSE 18X1.5 (NEEDLE) ×1 IMPLANT
NEEDLE FILTER BLUNT 18X 1/2SAF (NEEDLE) ×2
NEEDLE FILTER BLUNT 18X1 1/2 (NEEDLE) ×1 IMPLANT
NEEDLE HYPO 18GX1.5 SHARP (NEEDLE) ×3
NEEDLE HYPO 25X1 1.5 SAFETY (NEEDLE) IMPLANT
NEEDLE SPNL 18GX3.5 QUINCKE PK (NEEDLE) ×3 IMPLANT
NS IRRIG 1000ML POUR BTL (IV SOLUTION) ×3 IMPLANT
PACK BASIN DAY SURGERY FS (CUSTOM PROCEDURE TRAY) ×3 IMPLANT
PENCIL SMOKE EVACUATOR (MISCELLANEOUS) ×3 IMPLANT
PIN SAFETY STERILE (MISCELLANEOUS) IMPLANT
SHEET MEDIUM DRAPE 40X70 STRL (DRAPES) IMPLANT
SLEEVE SCD COMPRESS KNEE MED (STOCKING) ×3 IMPLANT
SPONGE LAP 18X18 RF (DISPOSABLE) ×9 IMPLANT
STAPLER INSORB 30 2030 C-SECTI (MISCELLANEOUS) ×3 IMPLANT
STAPLER VISISTAT 35W (STAPLE) ×6 IMPLANT
STRIP SUTURE WOUND CLOSURE 1/2 (MISCELLANEOUS) ×9 IMPLANT
SUT CHROMIC 4 0 PS 2 18 (SUTURE) IMPLANT
SUT ETHILON 2 0 FS 18 (SUTURE) IMPLANT
SUT ETHILON 3 0 PS 1 (SUTURE) IMPLANT
SUT MNCRL AB 4-0 PS2 18 (SUTURE) ×6 IMPLANT
SUT PDS 3-0 CT2 (SUTURE) ×6
SUT PDS II 3-0 CT2 27 ABS (SUTURE) ×2 IMPLANT
SUT VIC AB 3-0 PS1 18 (SUTURE)
SUT VIC AB 3-0 PS1 18XBRD (SUTURE) IMPLANT
SUT VLOC 180 P-14 24 (SUTURE) ×6 IMPLANT
SYR 50ML LL SCALE MARK (SYRINGE) IMPLANT
SYR BULB IRRIG 60ML STRL (SYRINGE) ×3 IMPLANT
SYR CONTROL 10ML LL (SYRINGE) IMPLANT
TAPE MEASURE VINYL STERILE (MISCELLANEOUS) IMPLANT
TOWEL GREEN STERILE FF (TOWEL DISPOSABLE) ×6 IMPLANT
TUBE CONNECTING 20'X1/4 (TUBING) ×1
TUBE CONNECTING 20X1/4 (TUBING) ×2 IMPLANT
TUBING INFILTRATION IT-10001 (TUBING) ×3 IMPLANT
UNDERPAD 30X36 HEAVY ABSORB (UNDERPADS AND DIAPERS) ×6 IMPLANT
YANKAUER SUCT BULB TIP NO VENT (SUCTIONS) ×3 IMPLANT

## 2020-09-05 NOTE — Anesthesia Procedure Notes (Signed)
Procedure Name: LMA Insertion Performed by: Carston Riedl S, CRNA Pre-anesthesia Checklist: Patient identified, Emergency Drugs available, Suction available and Patient being monitored Patient Re-evaluated:Patient Re-evaluated prior to induction Oxygen Delivery Method: Circle System Utilized Preoxygenation: Pre-oxygenation with 100% oxygen Induction Type: IV induction Ventilation: Mask ventilation without difficulty LMA: LMA inserted LMA Size: 3.0 Number of attempts: 1 Airway Equipment and Method: Bite block Placement Confirmation: positive ETCO2 Tube secured with: Tape Dental Injury: Teeth and Oropharynx as per pre-operative assessment        

## 2020-09-05 NOTE — Interval H&P Note (Signed)
History and Physical Interval Note:  09/05/2020 9:51 AM  Jill Pineda  has presented today for surgery, with the diagnosis of macromastia.  The various methods of treatment have been discussed with the patient and family. After consideration of risks, benefits and other options for treatment, the patient has consented to  Procedure(s): MAMMARY REDUCTION  (BREAST) (Bilateral) as a surgical intervention.  The patient's history has been reviewed, patient examined, no change in status, stable for surgery.  I have reviewed the patient's chart and labs.  Questions were answered to the patient's satisfaction.     [name]

## 2020-09-05 NOTE — Brief Op Note (Signed)
09/05/2020  12:08 PM  PATIENT:  Jill Pineda  40 y.o. female  PRE-OPERATIVE DIAGNOSIS:  macromastia  POST-OPERATIVE DIAGNOSIS:  macromastia  PROCEDURE:  Procedure(s): MAMMARY REDUCTION  (BREAST) (Bilateral)  SURGEON:  Surgeon(s) and Role:    * Modupe Shampine, Wendy Poet, MD - Primary  PHYSICIAN ASSISTANT: Enedina Finner, RNFA  ASSISTANTS: none   ANESTHESIA:   general  EBL:  40   BLOOD ADMINISTERED:none  DRAINS: none   LOCAL MEDICATIONS USED:  MARCAINE     SPECIMEN:  Source of Specimen:  r and l breast tissue  DISPOSITION OF SPECIMEN:  PATHOLOGY  COUNTS:  YES  TOURNIQUET:  * No tourniquets in log *  DICTATION: .Dragon Dictation  PLAN OF CARE: Discharge to home after PACU  PATIENT DISPOSITION:  PACU - hemodynamically stable.   Delay start of Pharmacological VTE agent (>24hrs) due to surgical blood loss or risk of bleeding: not applicable

## 2020-09-05 NOTE — Discharge Instructions (Signed)
Activity As tolerated: NO showers for 3 days No heavy activities  Diet: Regular  Wound Care: Keep dressing clean & dry for 3 days.  Keep wrap applied with compression as much as possible.    Do not change dressings for 3 days unless soiled.  Can change if needed but make sure to reapply wrap. After three days can remove wrap and shower.  Then reapply dressings if needed and continue compression with wrap or soft sports bra. Call doctor if any unusual problems occur such as pain, excessive bleeding, unrelieved nausea/vomiting, fever &/or chills  Follow-up appointment: Scheduled for next week.   Post Anesthesia Home Care Instructions  Activity: Get plenty of rest for the remainder of the day. A responsible individual must stay with you for 24 hours following the procedure.  For the next 24 hours, DO NOT: -Drive a car -Operate machinery -Drink alcoholic beverages -Take any medication unless instructed by your physician -Make any legal decisions or sign important papers.  Meals: Start with liquid foods such as gelatin or soup. Progress to regular foods as tolerated. Avoid greasy, spicy, heavy foods. If nausea and/or vomiting occur, drink only clear liquids until the nausea and/or vomiting subsides. Call your physician if vomiting continues.  Special Instructions/Symptoms: Your throat may feel dry or sore from the anesthesia or the breathing tube placed in your throat during surgery. If this causes discomfort, gargle with warm salt water. The discomfort should disappear within 24 hours.  If you had a scopolamine patch placed behind your ear for the management of post- operative nausea and/or vomiting:  1. The medication in the patch is effective for 72 hours, after which it should be removed.  Wrap patch in a tissue and discard in the trash. Wash hands thoroughly with soap and water. 2. You may remove the patch earlier than 72 hours if you experience unpleasant side effects which may  include dry mouth, dizziness or visual disturbances. 3. Avoid touching the patch. Wash your hands with soap and water after contact with the patch.     

## 2020-09-05 NOTE — Op Note (Signed)
Operative Note   DATE OF OPERATION: 09/05/2020  LOCATION: [facility]   SURGICAL DEPARTMENT: Plastic Surgery  PREOPERATIVE DIAGNOSES: Bilateral symptomatic macromastia.  POSTOPERATIVE DIAGNOSES:  same  PROCEDURE: Bilateral breast reduction with superomedial pedicle.  SURGEON: Ancil Linsey, MD  ASSISTANT: Enedina Finner, RNFA The advanced practice practitioner (APP) assisted throughout the case.  The APP was essential in retraction and counter traction when needed to make the case progress smoothly.  This retraction and assistance made it possible to see the tissue plans for the procedure.  The assistance was needed for blood control, tissue re-approximation and assisted with closure of the incision site.  ANESTHESIA: General.  COMPLICATIONS: None.   INDICATIONS FOR PROCEDURE:  The patient, Jill Pineda is a 40 y.o. female born on [birth year], is here for treatment of bilateral symptomatic macromastia. MRN: [id number]  CONSENT:  Informed consent was obtained directly from the patient. Risks, benefits and alternatives were fully discussed. Specific risks including but not limited to bleeding, infection, hematoma, seroma, scarring, pain, infection, contracture, asymmetry, wound healing problems, and need for further surgery were all discussed. The patient did have an ample opportunity to have questions answered to satisfaction.   DESCRIPTION OF PROCEDURE:  The patient was marked preoperatively for a Wise pattern skin excision.  The patient was taken to the operating room. SCDs were placed and antibiotics were given. General anesthesia was administered.The patient's operative site was prepped and draped in a sterile fashion. A time out was performed and all information was confirmed to be correct.  Right Breast: The breast was infiltrated with tumescent solution to help with hemostasis.  The nipple was marked with a cookie cutter.  A superomedial pedicle was drawn out with  the base of at least 8 cm in size.  A breast tourniquet was then applied and the pedicle was de-epithelialized.  Breast tourniquet was then let down and all incisions were made with a 10 blade.  The pedicle was then isolated down to the chest wall with cautery and the excision was performed removing tissue primarily inferiorly and laterally.  Hemostasis was obtained and the wound was stapled closed.  Left breast:  The breast was infiltrated with tumescent solution to help with hemostasis.  The nipple was marked with a cookie cutter.  A superomedial pedicle was drawn out with the base of at least 8 cm in size.  A breast tourniquet was then applied and the pedicle was de-epithelialized.  Breast tourniquet was then let down and all incisions were made with a 10 blade.  The pedicle was then isolated down to the chest wall with cautery and the excision was performed removing tissue primarily inferiorly and laterally.  Hemostasis was obtained and the wound was stapled closed.  Patient was then set up to check for size and symmetry.  Minor modifications were made.  This resulted in a total of 364g removed from the right side and 388g removed from the left side.  The inframammary incision was closed with a combination of buried in-sorb staples and a running 3-0 Quill suture.  The vertical and periareolar limbs were closed with interrupted buried 4-0 Monocryl and a running 4-0 Quill suture.  Steri-Strips were then applied along with a soft dressing and Ace wrap.  The patient tolerated the procedure well.  There were no complications. The patient was allowed to wake from anesthesia, extubated and taken to the recovery room in satisfactory condition.  I was present for the entire procedure.

## 2020-09-05 NOTE — Anesthesia Postprocedure Evaluation (Signed)
Anesthesia Post Note  Patient: Jill Pineda  Procedure(s) Performed: MAMMARY REDUCTION  (BREAST) (Bilateral Breast)     Patient location during evaluation: PACU Anesthesia Type: General Level of consciousness: awake and alert Pain management: pain level controlled Vital Signs Assessment: post-procedure vital signs reviewed and stable Respiratory status: spontaneous breathing, nonlabored ventilation, respiratory function stable and patient connected to nasal cannula oxygen Cardiovascular status: blood pressure returned to baseline and stable Postop Assessment: no apparent nausea or vomiting Anesthetic complications: no   No complications documented.  Last Vitals:  Vitals:   09/05/20 1310 09/05/20 1320  BP:  113/72  Pulse: 63 71  Resp: 17 18  Temp:  37.4 C  SpO2: 95% 97%    Last Pain:  Vitals:   09/05/20 1320  TempSrc:   PainSc: 3                  [name]

## 2020-09-05 NOTE — Anesthesia Preprocedure Evaluation (Signed)
Anesthesia Evaluation  Patient identified by MRN, date of birth, ID band Patient awake    Reviewed: Allergy & Precautions, NPO status , Patient's Chart, lab work & pertinent test results  Airway Mallampati: II  TM Distance: >3 FB Neck ROM: Full    Dental  (+) Teeth Intact   Pulmonary neg pulmonary ROS, former smoker,    Pulmonary exam normal        Cardiovascular negative cardio ROS   Rhythm:Regular Rate:Normal     Neuro/Psych Anxiety negative neurological ROS     GI/Hepatic negative GI ROS, Neg liver ROS,   Endo/Other  negative endocrine ROS  Renal/GU negative Renal ROS  negative genitourinary   Musculoskeletal Bilateral macromastia    Abdominal (+)  Abdomen: soft. Bowel sounds: normal.  Peds  Hematology  (+) anemia ,   Anesthesia Other Findings   Reproductive/Obstetrics                             Anesthesia Physical Anesthesia Plan  ASA: II  Anesthesia Plan: General   Post-op Pain Management:    Induction: Intravenous  PONV Risk Score and Plan: 3 and Ondansetron, Dexamethasone, Midazolam and Treatment may vary due to age or medical condition  Airway Management Planned: LMA and Mask  Additional Equipment: None  Intra-op Plan:   Post-operative Plan: Extubation in OR  Informed Consent: I have reviewed the patients History and Physical, chart, labs and discussed the procedure including the risks, benefits and alternatives for the proposed anesthesia with the patient or authorized representative who has indicated his/her understanding and acceptance.     Dental advisory given  Plan Discussed with: CRNA  Anesthesia Plan Comments: (Lab Results      Component                Value               Date                      PREGTESTUR               NEGATIVE            09/05/2020   )        Anesthesia Quick Evaluation

## 2020-09-05 NOTE — Transfer of Care (Signed)
Immediate Anesthesia Transfer of Care Note  Patient: Jill Pineda  Procedure(s) Performed: MAMMARY REDUCTION  (BREAST) (Bilateral Breast)  Patient Location: PACU  Anesthesia Type:General  Level of Consciousness: drowsy  Airway & Oxygen Therapy: Patient Spontanous Breathing and Patient connected to face mask oxygen  Post-op Assessment: Report given to RN and Post -op Vital signs reviewed and stable  Post vital signs: Reviewed and stable  Last Vitals:  Vitals Value Taken Time  BP 117/74 09/05/20 1212  Temp 36.6 C 09/05/20 1212  Pulse 60 09/05/20 1213  Resp 13 09/05/20 1213  SpO2 100 % 09/05/20 1213  Vitals shown include unvalidated device data.  Last Pain:  Vitals:   09/05/20 0836  TempSrc: Oral  PainSc: 0-No pain         Complications: No complications documented.

## 2020-09-07 ENCOUNTER — Encounter (HOSPITAL_BASED_OUTPATIENT_CLINIC_OR_DEPARTMENT_OTHER): Payer: Self-pay | Admitting: Plastic Surgery

## 2020-09-07 LAB — SURGICAL PATHOLOGY

## 2020-09-14 ENCOUNTER — Ambulatory Visit (INDEPENDENT_AMBULATORY_CARE_PROVIDER_SITE_OTHER): Payer: BLUE CROSS/BLUE SHIELD | Admitting: Plastic Surgery

## 2020-09-14 ENCOUNTER — Other Ambulatory Visit: Payer: Self-pay

## 2020-09-14 DIAGNOSIS — N62 Hypertrophy of breast: Secondary | ICD-10-CM

## 2020-09-14 NOTE — Progress Notes (Signed)
Patient presents about 1 week out from bilateral breast reduction.  She feels good and is happy.  On exam everything looks to be healing fine with intact incisions and viable nipple areolar complexes.  There is no subcutaneous fluid.  I have asked her to continue compressive garments and avoid strenuous activity.  We will see her again in a few weeks.  

## 2020-09-28 ENCOUNTER — Ambulatory Visit (INDEPENDENT_AMBULATORY_CARE_PROVIDER_SITE_OTHER): Payer: BLUE CROSS/BLUE SHIELD

## 2020-09-28 ENCOUNTER — Other Ambulatory Visit: Payer: Self-pay

## 2020-09-28 VITALS — BP 115/72 | HR 76 | Temp 97.8°F | Ht 65.0 in | Wt 135.0 lb

## 2020-09-28 DIAGNOSIS — N62 Hypertrophy of breast: Secondary | ICD-10-CM

## 2020-10-11 ENCOUNTER — Other Ambulatory Visit: Payer: Self-pay

## 2020-10-11 ENCOUNTER — Ambulatory Visit (INDEPENDENT_AMBULATORY_CARE_PROVIDER_SITE_OTHER): Payer: BLUE CROSS/BLUE SHIELD | Admitting: Plastic Surgery

## 2020-10-11 DIAGNOSIS — M545 Low back pain, unspecified: Secondary | ICD-10-CM

## 2020-10-11 DIAGNOSIS — M546 Pain in thoracic spine: Secondary | ICD-10-CM

## 2020-10-11 DIAGNOSIS — M940 Chondrocostal junction syndrome [Tietze]: Secondary | ICD-10-CM

## 2020-10-11 MED ORDER — LIDOCAINE 4 % EX PTCH: 1.0000 | MEDICATED_PATCH | Freq: Every day | CUTANEOUS | 2 refills | Status: DC

## 2020-10-11 NOTE — Progress Notes (Signed)
Patient presents about 5 weeks out from bilateral breast reduction.  She is overall happy with the reduction and does not have much pain or discomfort in those areas.  She does have pain and tenderness along the ribs inferiorly on the left side.  This is been present for a few weeks since her operation.  She feels like it is very tender and swollen in that area and it hurts when she twists or raises her arms.  She does not feel like it is gotten much better in the past couple weeks.  On exam she has tenderness over the costal cartilage on the left side.  There is mild swelling in that area but no signs of any skin changes.  Her breast reduction incisions are healing fine with no complications and nipple areolar complexes are viable.  No subcutaneous fluid in the breast area.  I explained that she could have some costochondritis there which may or may not be related to the operation.  It has persisted and may warrant some additional diagnostic investigation at this point.  I will plan to get a chest x-ray and an ultrasound to see if anything shows up.  She is doing ibuprofen and Aleve at home which is helping her to some degree.  She says she is allergic to Celebrex.  I will add a lidocaine patch to see if that helps at all.  I will plan to see her again in a couple weeks or sooner if anything changes.

## 2020-10-13 ENCOUNTER — Ambulatory Visit (HOSPITAL_COMMUNITY)
Admission: RE | Admit: 2020-10-13 | Discharge: 2020-10-13 | Disposition: A | Discharge status: Home / Self Care | Payer: BLUE CROSS/BLUE SHIELD | Source: Ambulatory Visit | Attending: Plastic Surgery | Admitting: Plastic Surgery

## 2020-10-13 ENCOUNTER — Other Ambulatory Visit: Payer: Self-pay

## 2020-10-13 DIAGNOSIS — M940 Chondrocostal junction syndrome [Tietze]: Secondary | ICD-10-CM | POA: Insufficient documentation

## 2020-10-17 NOTE — Progress Notes (Signed)
09/28/20- patient in office for f/u- s/p bilateral breast reduction by Dr. Arita Miss on 09/05/20 She has been improving significantly- but reports today that she feels "tightness" & tenderness under each breast .  Pain is 4-6/10- left > right. The pain has increased for the last 3-4 days. She has a pulling sensation more on the left side- even at rest.  There is no marked swelling or asymmetry.  The steri-strips remain intact & she does have some redness at the incision/steri-strip sites.  The incisions appear intact & she does not have any symptoms that indicate any infectious process.she denies chills/fever & no foul smelling odor or drainage.   She did indicate that she has increased her activity levels- which include walking - but she has not lifted any heavy objects & she is wearing her sports bra. She is eating/drinking and has normal bowel/bladder function. I removed the remaining steri strips & instructed her to use vaseline/gauze dressing as needed. I think she may be having a sensitivity to the steri-strips - & hopefully removing them will ease some of the "pulling" sensation & allow for complete healing. She also notes that her left nipple has a firm area & protrudes more than the right. I instructed her to perform "light" massage to the firm area & continue to use vaseline to keep incision & surrounding skin from being dried/scabbed & reduce irritation from the steri-strips  She will continue compression & monitor for any signs of worsening symptoms- & call for any concerns. Patient agrees with the plan of care. She will need a return to work note- she plans to return on 10/20/20- our office staff with provide this for her.she has a f/u with Dr. Arita Miss in a couple of weeks. She is reminded to call for any changes or concerns

## 2020-10-17 NOTE — Patient Instructions (Signed)
Patient will use vaseline/gauze OTC Ibuprofen as needed for tenderness. Continue compression sports bra  Call for any concerns

## 2020-10-18 ENCOUNTER — Telehealth: Payer: Self-pay

## 2020-10-18 NOTE — Telephone Encounter (Signed)
Patient called to inquire about the results of her x-ray which she had done at [facility] on 10/12/2020.  Please call.

## 2020-10-23 NOTE — Telephone Encounter (Signed)
Called and spoke with patient on (10/18/20) and informed that her recent chest x-ray was normal.  Patient verbalized understanding and agreed.    Patient stated that she was seen by her PCP today and was explained again that she has Costochondritis which she was given a prescription for Prednisone.  Patient stated that she will continue to see her PCP for the discomfort on her left side.//AB/CMA

## 2020-10-24 ENCOUNTER — Ambulatory Visit (HOSPITAL_COMMUNITY): Admission: RE | Admit: 2020-10-24 | Payer: BLUE CROSS/BLUE SHIELD | Source: Ambulatory Visit

## 2020-11-01 ENCOUNTER — Ambulatory Visit: Payer: BLUE CROSS/BLUE SHIELD | Admitting: Plastic Surgery

## 2022-07-09 IMAGING — DX DG CHEST 2V
2 series · 2 of 2 positions shown · non-contrast
Comparison: None.

CLINICAL DATA: Left rib pain.

EXAM:
CHEST - 2 VIEW

[chest pa]
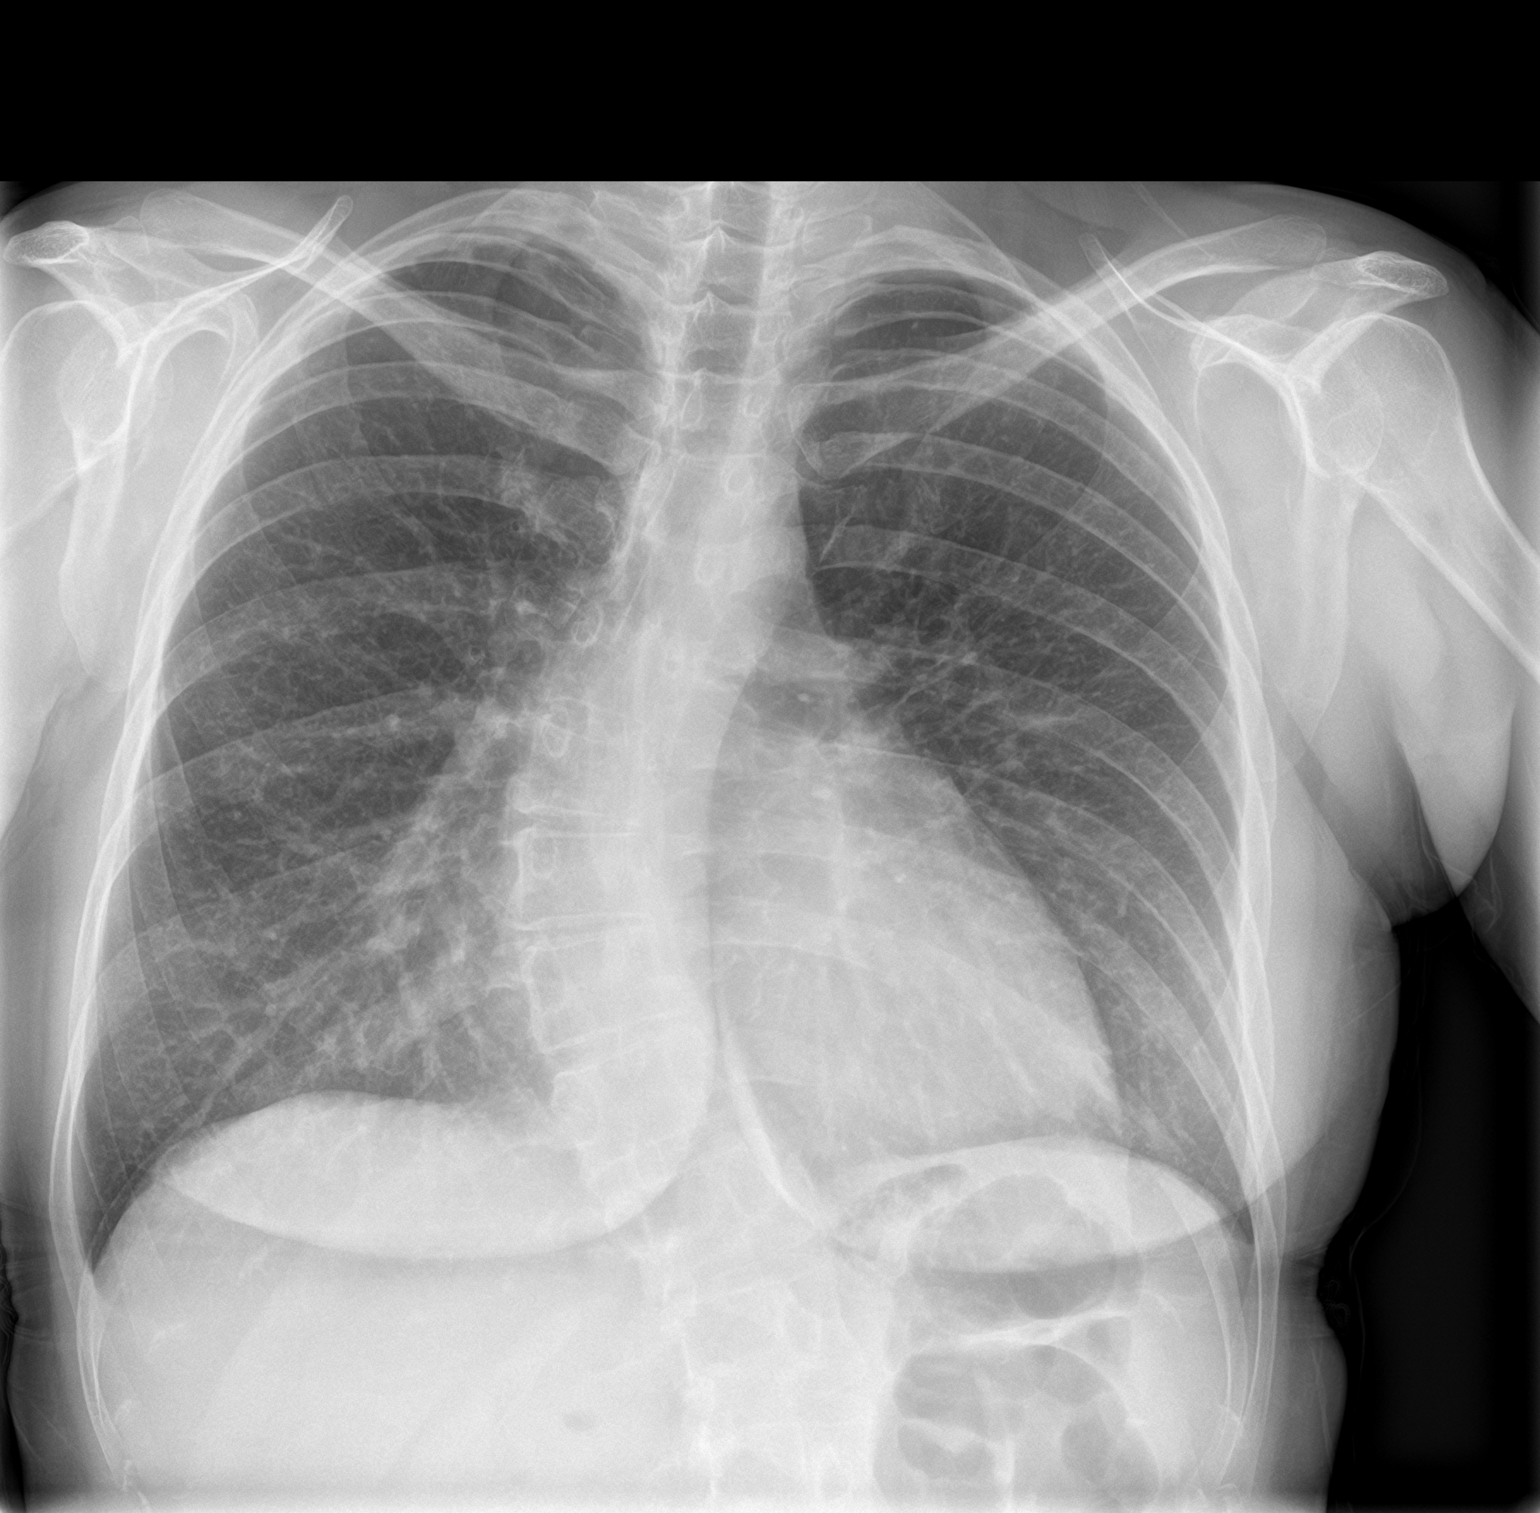

[chest lat]
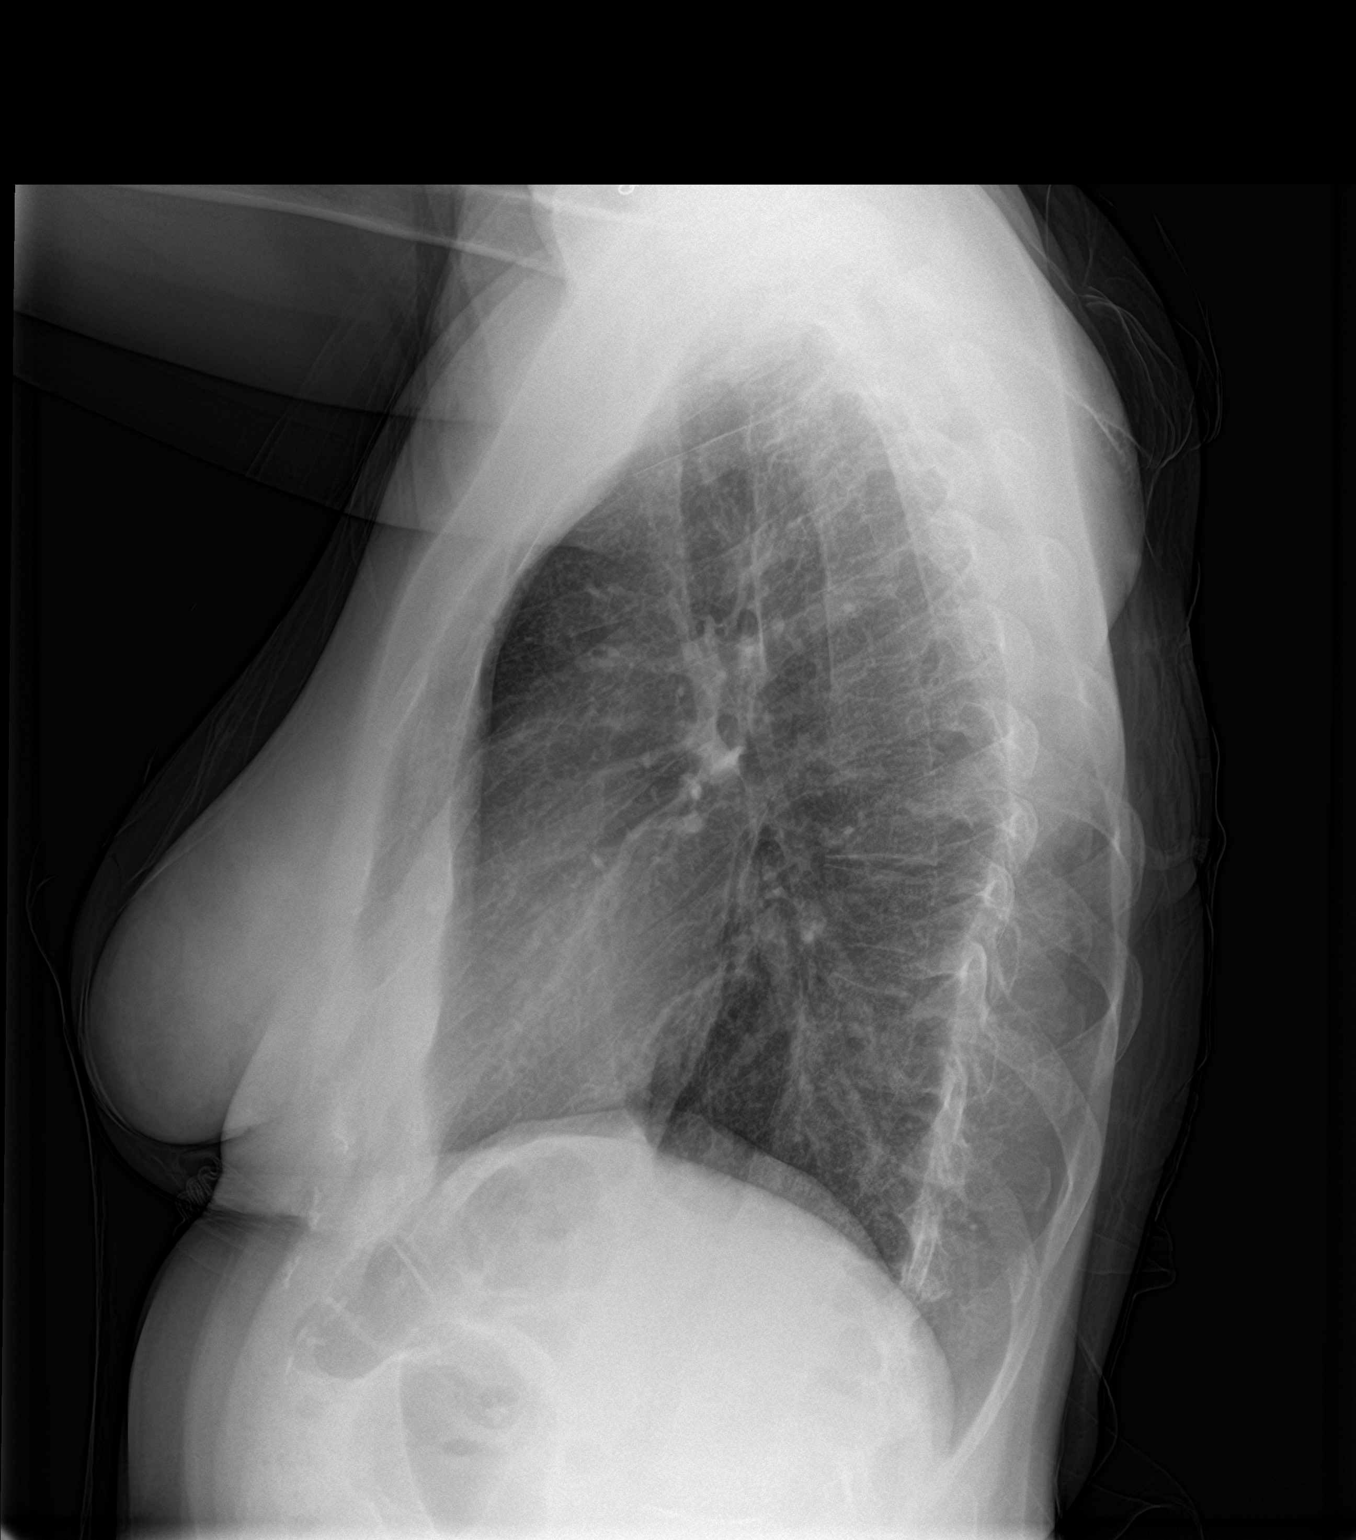

[2 of 2 positions shown; findings below may reference images not displayed]

FINDINGS: The heart size and mediastinal contours are within normal limits.
Both lungs are clear. The visualized skeletal structures are
unremarkable.
IMPRESSION: No active cardiopulmonary disease.

## 2022-08-06 ENCOUNTER — Other Ambulatory Visit: Payer: Self-pay

## 2022-08-06 ENCOUNTER — Telehealth: Payer: Self-pay | Admitting: Pharmacy Technician

## 2022-08-06 NOTE — Telephone Encounter (Signed)
Auth Submission: NO AUTH NEEDED Site of care: Site of care: [facility] Payer: [employer] Medication & CPT/J Code(s) submitted: Venofer (Iron Sucrose) J1756 Route of submission (phone, fax, portal):  Phone # Fax # Auth type: Buy/Bill Units/visits requested: 5 Reference number:  Approval from: 08/06/22 to 12/07/22

## 2022-08-07 ENCOUNTER — Encounter (HOSPITAL_COMMUNITY)
Admission: RE | Admit: 2022-08-07 | Discharge: 2022-08-07 | Disposition: A | Discharge status: Home / Self Care | Payer: BLUE CROSS/BLUE SHIELD | Source: Ambulatory Visit | Attending: Internal Medicine | Admitting: Internal Medicine

## 2022-08-07 VITALS — BP 109/76 | HR 58 | Temp 98.5°F | Resp 18

## 2022-08-07 DIAGNOSIS — D509 Iron deficiency anemia, unspecified: Secondary | ICD-10-CM | POA: Insufficient documentation

## 2022-08-07 DIAGNOSIS — K625 Hemorrhage of anus and rectum: Secondary | ICD-10-CM

## 2022-08-07 DIAGNOSIS — D500 Iron deficiency anemia secondary to blood loss (chronic): Secondary | ICD-10-CM

## 2022-08-07 MED ORDER — ACETAMINOPHEN 325 MG PO TABS
650.0000 mg | ORAL_TABLET | Freq: Once | ORAL | Status: AC
  Administered 2022-08-07: 650 mg via ORAL

## 2022-08-07 MED ORDER — DIPHENHYDRAMINE HCL 25 MG PO CAPS
25.0000 mg | ORAL_CAPSULE | Freq: Once | ORAL | Status: AC
  Administered 2022-08-07: 25 mg via ORAL

## 2022-08-07 MED ORDER — SODIUM CHLORIDE 0.9 % IV SOLN
200.0000 mg | Freq: Once | INTRAVENOUS | Status: AC
  Administered 2022-08-07: 200 mg via INTRAVENOUS
  Filled 2022-08-07: qty 200

## 2022-08-07 NOTE — Progress Notes (Signed)
Diagnosis: Iron Deficiency Anemia  Provider:  Kirstie Peri MD  Procedure: IV Infusion  IV Type: Peripheral, IV Location: R Antecubital  Venofer (Iron Sucrose), Dose: 200 mg  Infusion Start Time: 1047  Infusion Stop Time: 1105  Post Infusion IV Care: Observation period completed and Peripheral IV Discontinued  Discharge: Condition: Good, Destination: Home . AVS Declined  Performed by:  Marin Shutter, RN

## 2022-08-09 ENCOUNTER — Encounter (HOSPITAL_COMMUNITY)
Admission: RE | Admit: 2022-08-09 | Discharge: 2022-08-09 | Disposition: A | Discharge status: Home / Self Care | Payer: BLUE CROSS/BLUE SHIELD | Source: Ambulatory Visit | Attending: Internal Medicine | Admitting: Internal Medicine

## 2022-08-09 VITALS — BP 107/52 | HR 59 | Temp 98.7°F | Resp 18

## 2022-08-09 DIAGNOSIS — D509 Iron deficiency anemia, unspecified: Secondary | ICD-10-CM | POA: Diagnosis not present

## 2022-08-09 DIAGNOSIS — K625 Hemorrhage of anus and rectum: Secondary | ICD-10-CM

## 2022-08-09 DIAGNOSIS — D500 Iron deficiency anemia secondary to blood loss (chronic): Secondary | ICD-10-CM

## 2022-08-09 MED ORDER — ACETAMINOPHEN 325 MG PO TABS
650.0000 mg | ORAL_TABLET | Freq: Once | ORAL | Status: AC
  Administered 2022-08-09: 650 mg via ORAL
  Filled 2022-08-09: qty 2

## 2022-08-09 MED ORDER — SODIUM CHLORIDE 0.9 % IV SOLN
200.0000 mg | Freq: Once | INTRAVENOUS | Status: AC
  Administered 2022-08-09: 200 mg via INTRAVENOUS
  Filled 2022-08-09: qty 10

## 2022-08-09 MED ORDER — DIPHENHYDRAMINE HCL 25 MG PO CAPS
25.0000 mg | ORAL_CAPSULE | Freq: Once | ORAL | Status: AC
  Administered 2022-08-09: 25 mg via ORAL
  Filled 2022-08-09: qty 1

## 2022-08-09 NOTE — Progress Notes (Signed)
Diagnosis: Iron Deficiency Anemia  Provider:  Kirstie Peri MD  Procedure: IV Infusion  IV Type: Peripheral, IV Location: R Antecubital  Venofer (Iron Sucrose), Dose: 200 mg  Infusion Start Time: 1044  Infusion Stop Time: 1100  Post Infusion IV Care: Observation period completed and Peripheral IV Discontinued  Discharge: Condition: Good, Destination: Home . AVS Declined  Performed by:  Marin Shutter, RN

## 2022-08-12 ENCOUNTER — Encounter (HOSPITAL_COMMUNITY)
Admission: RE | Admit: 2022-08-12 | Discharge: 2022-08-12 | Disposition: A | Discharge status: Home / Self Care | Payer: BLUE CROSS/BLUE SHIELD | Source: Ambulatory Visit | Attending: Internal Medicine | Admitting: Internal Medicine

## 2022-08-12 VITALS — BP 112/63 | HR 59 | Temp 98.2°F | Resp 16

## 2022-08-12 DIAGNOSIS — D500 Iron deficiency anemia secondary to blood loss (chronic): Secondary | ICD-10-CM

## 2022-08-12 DIAGNOSIS — D509 Iron deficiency anemia, unspecified: Secondary | ICD-10-CM | POA: Diagnosis not present

## 2022-08-12 DIAGNOSIS — K625 Hemorrhage of anus and rectum: Secondary | ICD-10-CM

## 2022-08-12 MED ORDER — ACETAMINOPHEN 325 MG PO TABS
650.0000 mg | ORAL_TABLET | Freq: Once | ORAL | Status: AC
  Administered 2022-08-12: 650 mg via ORAL
  Filled 2022-08-12: qty 2

## 2022-08-12 MED ORDER — SODIUM CHLORIDE 0.9 % IV SOLN
200.0000 mg | Freq: Once | INTRAVENOUS | Status: AC
  Administered 2022-08-12: 200 mg via INTRAVENOUS
  Filled 2022-08-12: qty 10

## 2022-08-12 MED ORDER — DIPHENHYDRAMINE HCL 25 MG PO CAPS
25.0000 mg | ORAL_CAPSULE | Freq: Once | ORAL | Status: AC
  Administered 2022-08-12: 25 mg via ORAL
  Filled 2022-08-12: qty 1

## 2022-08-12 NOTE — Progress Notes (Signed)
Diagnosis: Iron Deficiency Anemia  Provider:  Kirstie Peri MD  Procedure: IV Infusion  IV Type: Peripheral, IV Location: R Antecubital  Venofer (Iron Sucrose), Dose: 200 mg  Infusion Start Time: 1050  Infusion Stop Time: 1106  Post Infusion IV Care: Peripheral IV Discontinued  Discharge: Condition: Good, Destination: Home . AVS Declined  Performed by:  Marlow Baars [place]-Burchett, RN

## 2022-08-14 ENCOUNTER — Encounter (HOSPITAL_COMMUNITY)
Admission: RE | Admit: 2022-08-14 | Discharge: 2022-08-14 | Disposition: A | Discharge status: Home / Self Care | Payer: BLUE CROSS/BLUE SHIELD | Source: Ambulatory Visit | Attending: Internal Medicine | Admitting: Internal Medicine

## 2022-08-14 VITALS — BP 96/55 | HR 58 | Temp 98.0°F | Resp 19

## 2022-08-14 DIAGNOSIS — K625 Hemorrhage of anus and rectum: Secondary | ICD-10-CM

## 2022-08-14 DIAGNOSIS — D509 Iron deficiency anemia, unspecified: Secondary | ICD-10-CM | POA: Diagnosis not present

## 2022-08-14 DIAGNOSIS — D500 Iron deficiency anemia secondary to blood loss (chronic): Secondary | ICD-10-CM

## 2022-08-14 MED ORDER — SODIUM CHLORIDE 0.9 % IV SOLN
200.0000 mg | Freq: Once | INTRAVENOUS | Status: AC
  Administered 2022-08-14: 200 mg via INTRAVENOUS
  Filled 2022-08-14: qty 200

## 2022-08-14 MED ORDER — DIPHENHYDRAMINE HCL 25 MG PO CAPS
25.0000 mg | ORAL_CAPSULE | Freq: Once | ORAL | Status: AC
  Administered 2022-08-14: 25 mg via ORAL

## 2022-08-14 MED ORDER — ACETAMINOPHEN 325 MG PO TABS
650.0000 mg | ORAL_TABLET | Freq: Once | ORAL | Status: AC
  Administered 2022-08-14: 650 mg via ORAL

## 2022-08-14 NOTE — Progress Notes (Signed)
Diagnosis: Iron Deficiency Anemia  Provider:  Kirstie Peri MD  Procedure: IV Infusion  IV Type: Peripheral, IV Location: R Antecubital  Venofer (Iron Sucrose), Dose: 200 mg  Infusion Start Time: 1051  Infusion Stop Time: 1106  Post Infusion IV Care: Peripheral IV Discontinued  Discharge: Condition: Good, Destination: Home . AVS Declined  Performed by:  Marlow Baars [place]-Burchett, RN

## 2022-08-16 ENCOUNTER — Encounter (HOSPITAL_COMMUNITY)
Admission: RE | Admit: 2022-08-16 | Discharge: 2022-08-16 | Disposition: A | Discharge status: Home / Self Care | Payer: BLUE CROSS/BLUE SHIELD | Source: Ambulatory Visit | Attending: Internal Medicine | Admitting: Internal Medicine

## 2022-08-16 VITALS — BP 96/64 | HR 61 | Temp 98.3°F | Resp 12

## 2022-08-16 DIAGNOSIS — D500 Iron deficiency anemia secondary to blood loss (chronic): Secondary | ICD-10-CM

## 2022-08-16 DIAGNOSIS — D509 Iron deficiency anemia, unspecified: Secondary | ICD-10-CM | POA: Diagnosis not present

## 2022-08-16 DIAGNOSIS — K625 Hemorrhage of anus and rectum: Secondary | ICD-10-CM

## 2022-08-16 MED ORDER — SODIUM CHLORIDE 0.9 % IV SOLN
200.0000 mg | Freq: Once | INTRAVENOUS | Status: AC
  Administered 2022-08-16: 200 mg via INTRAVENOUS
  Filled 2022-08-16: qty 200

## 2022-08-16 MED ORDER — ACETAMINOPHEN 325 MG PO TABS
650.0000 mg | ORAL_TABLET | Freq: Once | ORAL | Status: AC
  Administered 2022-08-16: 650 mg via ORAL
  Filled 2022-08-16: qty 2

## 2022-08-16 MED ORDER — DIPHENHYDRAMINE HCL 25 MG PO CAPS
25.0000 mg | ORAL_CAPSULE | Freq: Once | ORAL | Status: AC
  Administered 2022-08-16: 25 mg via ORAL
  Filled 2022-08-16: qty 1

## 2022-08-16 NOTE — Progress Notes (Signed)
Diagnosis: Iron Deficiency Anemia  Provider:  Kirstie Peri MD  Procedure: IV Infusion  IV Type: Peripheral, IV Location: R Antecubital  Venofer (Iron Sucrose), Dose: 200 mg  Infusion Start Time: 1015  Infusion Stop Time: 1035  Post Infusion IV Care: Peripheral IV Discontinued. Patient declined observation.  Discharge: Condition: Good, Destination: Home . AVS Declined  Performed by:  Wyvonne Lenz, RN

## 2022-08-16 NOTE — Addendum Note (Signed)
Encounter addended by: Wyvonne Lenz, RN on: 08/16/2022 11:17 AM  Actions taken: Therapy plan modified

## 2022-10-07 ENCOUNTER — Other Ambulatory Visit: Payer: Self-pay | Admitting: Internal Medicine

## 2022-10-07 DIAGNOSIS — Z1231 Encounter for screening mammogram for malignant neoplasm of breast: Secondary | ICD-10-CM

## 2022-10-08 ENCOUNTER — Ambulatory Visit
Admission: RE | Admit: 2022-10-08 | Discharge: 2022-10-08 | Disposition: A | Discharge status: Home / Self Care | Payer: BLUE CROSS/BLUE SHIELD | Source: Ambulatory Visit | Attending: Internal Medicine | Admitting: Internal Medicine

## 2022-10-08 DIAGNOSIS — Z1231 Encounter for screening mammogram for malignant neoplasm of breast: Secondary | ICD-10-CM

## 2022-10-16 ENCOUNTER — Other Ambulatory Visit: Payer: Self-pay

## 2023-08-26 ENCOUNTER — Other Ambulatory Visit: Payer: Self-pay | Admitting: Internal Medicine

## 2023-08-26 DIAGNOSIS — Z1231 Encounter for screening mammogram for malignant neoplasm of breast: Secondary | ICD-10-CM

## 2023-09-10 ENCOUNTER — Other Ambulatory Visit: Payer: Self-pay

## 2023-09-12 ENCOUNTER — Telehealth: Payer: Self-pay

## 2023-09-12 ENCOUNTER — Other Ambulatory Visit: Payer: Self-pay

## 2023-09-12 NOTE — Telephone Encounter (Signed)
 Auth Submission: NO AUTH NEEDED Site of care: Site of care: [facility] Payer: [employer]bs Medication & CPT/J Code(s) submitted: Venofer  (Iron  Sucrose) J1756 Route of submission (phone, fax, portal): phone Phone # Fax # Auth type: Buy/Bill PB Units/visits requested: 200mg  x 5doses Reference number: [id number] Approval from: 09/12/23 to 03/31/24

## 2023-09-18 ENCOUNTER — Encounter: Attending: Neurology | Admitting: Emergency Medicine

## 2023-09-18 VITALS — BP 117/74 | HR 64 | Temp 97.9°F | Resp 16

## 2023-09-18 DIAGNOSIS — D500 Iron deficiency anemia secondary to blood loss (chronic): Secondary | ICD-10-CM | POA: Diagnosis not present

## 2023-09-18 DIAGNOSIS — K625 Hemorrhage of anus and rectum: Secondary | ICD-10-CM

## 2023-09-18 MED ORDER — DIPHENHYDRAMINE HCL 25 MG PO CAPS
25.0000 mg | ORAL_CAPSULE | Freq: Once | ORAL | Status: AC
  Administered 2023-09-18: 25 mg via ORAL

## 2023-09-18 MED ORDER — ACETAMINOPHEN 325 MG PO TABS
650.0000 mg | ORAL_TABLET | Freq: Once | ORAL | Status: AC
  Administered 2023-09-18: 650 mg via ORAL

## 2023-09-18 MED ORDER — IRON SUCROSE 20 MG/ML IV SOLN
200.0000 mg | Freq: Once | INTRAVENOUS | Status: AC
  Administered 2023-09-18: 200 mg via INTRAVENOUS

## 2023-09-18 NOTE — Progress Notes (Signed)
 Diagnosis: Iron  Deficiency Anemia  Provider:  Theoplis Fix MD  Procedure: IV Push  IV Type: Peripheral, IV Location: left AC  Venofer  (Iron  Sucrose), Dose: 200 mg  Post Infusion IV Care: Observation period completed and Peripheral IV Discontinued  Discharge: Condition: Good, Destination: Home . AVS Provided  Performed by:  Arlina Benjamin, RN

## 2023-09-22 ENCOUNTER — Encounter (INDEPENDENT_AMBULATORY_CARE_PROVIDER_SITE_OTHER)

## 2023-09-22 VITALS — BP 98/63 | HR 54 | Temp 97.7°F | Resp 16

## 2023-09-22 DIAGNOSIS — K625 Hemorrhage of anus and rectum: Secondary | ICD-10-CM | POA: Diagnosis not present

## 2023-09-22 DIAGNOSIS — E611 Iron deficiency: Secondary | ICD-10-CM

## 2023-09-22 DIAGNOSIS — D500 Iron deficiency anemia secondary to blood loss (chronic): Secondary | ICD-10-CM

## 2023-09-22 MED ORDER — DIPHENHYDRAMINE HCL 25 MG PO CAPS
25.0000 mg | ORAL_CAPSULE | Freq: Four times a day (QID) | ORAL | Status: AC | PRN
  Administered 2023-09-22: 25 mg via ORAL

## 2023-09-22 MED ORDER — ACETAMINOPHEN 325 MG PO TABS
650.0000 mg | ORAL_TABLET | Freq: Once | ORAL | Status: AC
  Administered 2023-09-22: 650 mg via ORAL

## 2023-09-22 MED ORDER — IRON SUCROSE 20 MG/ML IV SOLN
200.0000 mg | Freq: Once | INTRAVENOUS | Status: AC
  Administered 2023-09-22: 200 mg via INTRAVENOUS

## 2023-09-22 NOTE — Progress Notes (Signed)
 Diagnosis: Iron  Deficiency Anemia  Provider:  Maree Isles MD  Procedure: IV Push  IV Type: Peripheral, IV Location: L Antecubital  Venofer  (Iron  Sucrose), Dose: 200 mg  Post Infusion IV Care: Observation period completed and Peripheral IV Discontinued  Discharge: Condition: Good, Destination: Home . AVS Declined  Performed by:  Delon ONEIDA Officer, RN

## 2023-09-25 ENCOUNTER — Encounter (INDEPENDENT_AMBULATORY_CARE_PROVIDER_SITE_OTHER)

## 2023-09-25 VITALS — BP 97/57 | HR 58 | Temp 98.2°F | Resp 16

## 2023-09-25 DIAGNOSIS — D500 Iron deficiency anemia secondary to blood loss (chronic): Secondary | ICD-10-CM

## 2023-09-25 DIAGNOSIS — K625 Hemorrhage of anus and rectum: Secondary | ICD-10-CM | POA: Diagnosis not present

## 2023-09-25 MED ORDER — IRON SUCROSE 20 MG/ML IV SOLN
200.0000 mg | Freq: Once | INTRAVENOUS | Status: AC
  Administered 2023-09-25: 200 mg via INTRAVENOUS

## 2023-09-25 MED ORDER — DIPHENHYDRAMINE HCL 25 MG PO CAPS
25.0000 mg | ORAL_CAPSULE | Freq: Once | ORAL | Status: AC
  Administered 2023-09-25: 25 mg via ORAL

## 2023-09-25 MED ORDER — ACETAMINOPHEN 325 MG PO TABS
650.0000 mg | ORAL_TABLET | Freq: Once | ORAL | Status: AC
Start: 2023-09-25 — End: 2023-09-25
  Administered 2023-09-25: 650 mg via ORAL

## 2023-09-25 NOTE — Progress Notes (Signed)
 Diagnosis: Iron  Deficiency Anemia  Provider:  Maree Isles MD  Procedure: IV Push  IV Type: Peripheral, IV Location: R Antecubital  Venofer  (Iron  Sucrose), Dose: 200 mg  Post Infusion IV Care: Observation period completed  Discharge: Condition: Good, Destination: Home . AVS Declined  Performed by:  Eleanor DELENA Bloch, RN

## 2023-09-30 ENCOUNTER — Encounter: Attending: Internal Medicine | Admitting: Emergency Medicine

## 2023-09-30 VITALS — BP 103/63 | HR 59 | Temp 98.3°F | Resp 16

## 2023-09-30 DIAGNOSIS — D500 Iron deficiency anemia secondary to blood loss (chronic): Secondary | ICD-10-CM | POA: Diagnosis not present

## 2023-09-30 DIAGNOSIS — K625 Hemorrhage of anus and rectum: Secondary | ICD-10-CM | POA: Diagnosis not present

## 2023-09-30 MED ORDER — ACETAMINOPHEN 325 MG PO TABS
650.0000 mg | ORAL_TABLET | Freq: Once | ORAL | Status: AC
  Administered 2023-09-30: 650 mg via ORAL

## 2023-09-30 MED ORDER — DIPHENHYDRAMINE HCL 25 MG PO CAPS
25.0000 mg | ORAL_CAPSULE | Freq: Once | ORAL | Status: AC
  Administered 2023-09-30: 25 mg via ORAL

## 2023-09-30 MED ORDER — IRON SUCROSE 20 MG/ML IV SOLN
200.0000 mg | Freq: Once | INTRAVENOUS | Status: AC
  Administered 2023-09-30: 200 mg via INTRAVENOUS

## 2023-09-30 NOTE — Progress Notes (Signed)
 Diagnosis:  Iron  Deficiency Anemia  Provider:  Maree Isles MD  Procedure: IV Push  IV Type: Peripheral, IV Location: L Antecubital  Venofer  (Iron  Sucrose), Dose: 200 mg  Post Infusion IV Care: Observation period completed and Peripheral IV Discontinued  Discharge: Condition: Good, Destination: Home . AVS Provided  Performed by:  Delon ONEIDA Officer, RN

## 2023-10-02 ENCOUNTER — Encounter (INDEPENDENT_AMBULATORY_CARE_PROVIDER_SITE_OTHER): Admitting: *Deleted

## 2023-10-02 ENCOUNTER — Ambulatory Visit

## 2023-10-02 VITALS — BP 103/61 | HR 61 | Temp 98.5°F | Resp 16

## 2023-10-02 DIAGNOSIS — D500 Iron deficiency anemia secondary to blood loss (chronic): Secondary | ICD-10-CM | POA: Diagnosis not present

## 2023-10-02 DIAGNOSIS — K625 Hemorrhage of anus and rectum: Secondary | ICD-10-CM | POA: Diagnosis not present

## 2023-10-02 MED ORDER — ACETAMINOPHEN 325 MG PO TABS
650.0000 mg | ORAL_TABLET | Freq: Once | ORAL | Status: AC
  Administered 2023-10-02: 650 mg via ORAL

## 2023-10-02 MED ORDER — DIPHENHYDRAMINE HCL 25 MG PO CAPS
25.0000 mg | ORAL_CAPSULE | Freq: Once | ORAL | Status: AC
  Administered 2023-10-02: 25 mg via ORAL

## 2023-10-02 MED ORDER — IRON SUCROSE 20 MG/ML IV SOLN: 200.0000 mg | Freq: Once | INTRAVENOUS | Status: DC

## 2023-10-02 NOTE — Progress Notes (Signed)
 Diagnosis: Iron  Deficiency Anemia  Provider:  Maree Isles MD  Procedure: IV Push  IV Type: Peripheral, IV Location: L Antecubital  Venofer  (Iron  Sucrose), Dose: 200 mg  Post Infusion IV Care: Patient declined observation  Discharge: Condition: Good, Destination: Home . AVS Provided  Performed by:  Baldwin Darice Helling, RN

## 2023-11-04 ENCOUNTER — Ambulatory Visit
Admission: RE | Admit: 2023-11-04 | Discharge: 2023-11-04 | Disposition: A | Discharge status: Home / Self Care | Source: Ambulatory Visit | Attending: Internal Medicine | Admitting: Internal Medicine

## 2023-11-04 DIAGNOSIS — Z1231 Encounter for screening mammogram for malignant neoplasm of breast: Secondary | ICD-10-CM

## 2023-11-07 ENCOUNTER — Other Ambulatory Visit: Payer: Self-pay | Admitting: Internal Medicine

## 2023-11-07 DIAGNOSIS — R928 Other abnormal and inconclusive findings on diagnostic imaging of breast: Secondary | ICD-10-CM

## 2023-11-12 ENCOUNTER — Other Ambulatory Visit: Payer: Self-pay | Admitting: Internal Medicine

## 2023-11-12 DIAGNOSIS — R928 Other abnormal and inconclusive findings on diagnostic imaging of breast: Secondary | ICD-10-CM

## 2023-11-12 DIAGNOSIS — N6489 Other specified disorders of breast: Secondary | ICD-10-CM

## 2023-11-13 ENCOUNTER — Ambulatory Visit
Admission: RE | Admit: 2023-11-13 | Discharge: 2023-11-13 | Disposition: A | Discharge status: Home / Self Care | Source: Ambulatory Visit | Attending: Internal Medicine | Admitting: Internal Medicine

## 2023-11-13 ENCOUNTER — Ambulatory Visit

## 2023-11-13 DIAGNOSIS — R928 Other abnormal and inconclusive findings on diagnostic imaging of breast: Secondary | ICD-10-CM

## 2023-11-14 ENCOUNTER — Other Ambulatory Visit

## 2023-11-14 ENCOUNTER — Encounter

## 2024-02-17 ENCOUNTER — Inpatient Hospital Stay: Admitting: Oncology

## 2024-02-17 ENCOUNTER — Inpatient Hospital Stay

## 2024-03-05 ENCOUNTER — Inpatient Hospital Stay

## 2024-03-05 ENCOUNTER — Inpatient Hospital Stay: Attending: Hematology | Admitting: Hematology

## 2024-03-05 ENCOUNTER — Inpatient Hospital Stay: Admitting: Hematology

## 2024-03-05 ENCOUNTER — Encounter: Payer: Self-pay | Admitting: Hematology

## 2024-03-05 VITALS — BP 105/59 | HR 73 | Temp 98.8°F | Resp 18 | Ht 66.5 in | Wt 133.0 lb

## 2024-03-05 DIAGNOSIS — R5383 Other fatigue: Secondary | ICD-10-CM | POA: Insufficient documentation

## 2024-03-05 DIAGNOSIS — D500 Iron deficiency anemia secondary to blood loss (chronic): Secondary | ICD-10-CM | POA: Insufficient documentation

## 2024-03-05 DIAGNOSIS — D649 Anemia, unspecified: Secondary | ICD-10-CM

## 2024-03-05 DIAGNOSIS — K921 Melena: Secondary | ICD-10-CM | POA: Insufficient documentation

## 2024-03-05 DIAGNOSIS — Z79899 Other long term (current) drug therapy: Secondary | ICD-10-CM | POA: Insufficient documentation

## 2024-03-05 DIAGNOSIS — Z1321 Encounter for screening for nutritional disorder: Secondary | ICD-10-CM

## 2024-03-05 DIAGNOSIS — G4710 Hypersomnia, unspecified: Secondary | ICD-10-CM | POA: Diagnosis not present

## 2024-03-05 DIAGNOSIS — E559 Vitamin D deficiency, unspecified: Secondary | ICD-10-CM | POA: Diagnosis not present

## 2024-03-05 DIAGNOSIS — N920 Excessive and frequent menstruation with regular cycle: Secondary | ICD-10-CM | POA: Insufficient documentation

## 2024-03-05 DIAGNOSIS — Z886 Allergy status to analgesic agent status: Secondary | ICD-10-CM | POA: Insufficient documentation

## 2024-03-05 DIAGNOSIS — R634 Abnormal weight loss: Secondary | ICD-10-CM | POA: Insufficient documentation

## 2024-03-05 DIAGNOSIS — Z8719 Personal history of other diseases of the digestive system: Secondary | ICD-10-CM | POA: Insufficient documentation

## 2024-03-05 DIAGNOSIS — F17290 Nicotine dependence, other tobacco product, uncomplicated: Secondary | ICD-10-CM | POA: Insufficient documentation

## 2024-03-05 DIAGNOSIS — Z803 Family history of malignant neoplasm of breast: Secondary | ICD-10-CM | POA: Insufficient documentation

## 2024-03-05 DIAGNOSIS — K5900 Constipation, unspecified: Secondary | ICD-10-CM | POA: Insufficient documentation

## 2024-03-05 LAB — CBC WITH DIFFERENTIAL/PLATELET
Abs Immature Granulocytes: 0.02 K/uL (ref 0.00–0.07)
Basophils Absolute: 0.1 K/uL (ref 0.0–0.1)
Basophils Relative: 1 %
Eosinophils Absolute: 0.1 K/uL (ref 0.0–0.5)
Eosinophils Relative: 2 %
HCT: 37.2 % (ref 36.0–46.0)
Hemoglobin: 12 g/dL (ref 12.0–15.0)
Immature Granulocytes: 0 %
Lymphocytes Relative: 32 %
Lymphs Abs: 1.7 K/uL (ref 0.7–4.0)
MCH: 31.5 pg (ref 26.0–34.0)
MCHC: 32.3 g/dL (ref 30.0–36.0)
MCV: 97.6 fL (ref 80.0–100.0)
Monocytes Absolute: 0.6 K/uL (ref 0.1–1.0)
Monocytes Relative: 10 %
Neutro Abs: 2.9 K/uL (ref 1.7–7.7)
Neutrophils Relative %: 55 %
Platelets: 289 K/uL (ref 150–400)
RBC: 3.81 MIL/uL — ABNORMAL LOW (ref 3.87–5.11)
RDW: 12.5 % (ref 11.5–15.5)
WBC: 5.3 K/uL (ref 4.0–10.5)
nRBC: 0 % (ref 0.0–0.2)

## 2024-03-05 LAB — IRON AND TIBC
Iron: 53 ug/dL (ref 28–170)
Saturation Ratios: 18 % (ref 10.4–31.8)
TIBC: 300 ug/dL (ref 250–450)
UIBC: 247 ug/dL

## 2024-03-05 LAB — VITAMIN B12: Vitamin B-12: 290 pg/mL (ref 180–914)

## 2024-03-05 LAB — VITAMIN D 25 HYDROXY (VIT D DEFICIENCY, FRACTURES): Vit D, 25-Hydroxy: 26.77 ng/mL — ABNORMAL LOW (ref 30–100)

## 2024-03-05 LAB — RETIC PANEL
Immature Retic Fract: 7.2 % (ref 2.3–15.9)
RBC.: 3.83 MIL/uL — ABNORMAL LOW (ref 3.87–5.11)
Retic Count, Absolute: 41 K/uL (ref 19.0–186.0)
Retic Ct Pct: 1.1 % (ref 0.4–3.1)
Reticulocyte Hemoglobin: 34.6 pg (ref >27.9)

## 2024-03-05 LAB — FERRITIN: Ferritin: 41 ng/mL (ref 11–307)

## 2024-03-05 NOTE — Progress Notes (Signed)
 [facility]   [phone] [phone]   Clinic New Consult Note   Patient Care Team: Maree Isles, MD as PCP - General (Internal Medicine) Shaaron Lamar HERO, MD as Consulting Physician (Gastroenterology) 03/05/2024  CHIEF COMPLAINTS/PURPOSE OF CONSULTATION:  Iron  deficiency  REFERRING PHYSICIAN: [facility]  Discussed the use of AI scribe software for clinical note transcription with the patient, who gave verbal consent to proceed.  History of Present Illness Jill Pineda is a 43 year old female who presents with iron  deficiency.  She has had iron  deficiency for at least ten years, managed with oral iron  (including multivitamins and prescribed iron ) and periodic IV iron  infusions, most recently in July 2025. Infusions improve symptoms temporarily, but fatigue, difficulty getting out of bed, and excessive sleepiness return after about two months.  Her menstrual periods are very heavy monthly. The first two to three days are severe, requiring super plus or ultra tampons plus pads, changed almost every hour to 90 minutes. Over the last two months she has noticed increased clotting. She has not seen a gynecologist recently and has only discussed this with her primary care doctor.  She has occasional blood in her stool, which she relates to constipation and irregular bowel movements from oral iron . She denies other chronic illnesses or rheumatologic disorders.  Her past medical history is notable for a C-section and breast reduction surgery. Her mother has had breast cancer twice. There is no known family history of anemia.  She drinks alcohol rarely, has quit smoking, and does not use recreational drugs. She has two children.  Her current medications include treatment for acid reflux and Xanax for anxiety. She is unsure if her B12 or vitamin D  levels have been checked recently and recalls a B12 level of 301 in 2021. She has lost weight  intentionally and uses semaglutide injections intermittently.  She denies unintentional weight loss, night sweats, and significant pain. She reports persistent fatigue and low energy.     MEDICAL HISTORY:  Past Medical History:  Diagnosis Date   Anemia    Anxiety    Constipation     SURGICAL HISTORY: Past Surgical History:  Procedure Laterality Date   BREAST REDUCTION SURGERY Bilateral 09/05/2020   Procedure: MAMMARY REDUCTION  (BREAST);  Surgeon: Elisabeth Craig RAMAN, MD;  Location: [facility];  Service: Plastics;  Laterality: Bilateral;   CESAREAN SECTION     X 2   COLONOSCOPY N/A 10/13/2019   hemorrhoids, both external and internal.   ELBOW SURGERY     REDUCTION MAMMAPLASTY     WISDOM TOOTH EXTRACTION      SOCIAL HISTORY: Social History   Socioeconomic History   Marital status: Married    Spouse name: Not on file   Number of children: 2   Years of education: Not on file   Highest education level: Not on file  Occupational History   Occupation: [employer]  Tobacco Use   Smoking status: Former    Current packs/day: 0.00    Types: Cigarettes    Quit date: 08/08/2020    Years since quitting: 3.5   Smokeless tobacco: Never  Vaping Use   Vaping status: Some Days  Substance and Sexual Activity   Alcohol use: Yes    Comment: occ   Drug use: Never   Sexual activity: Not on file  Other Topics Concern   Not on file  Social History Narrative   Not on file   Social Drivers of Health  Financial Resource Strain: Not on file  Food Insecurity: Not on file  Transportation Needs: Not on file  Physical Activity: Not on file  Stress: Not on file  Social Connections: Not on file  Intimate Partner Violence: Not on file    FAMILY HISTORY: Family History  Problem Relation Age of Onset   Cancer Mother        breast cancer   Healthy Father     ALLERGIES:  is allergic to celebrex [celecoxib] and citalopram.  MEDICATIONS:  Current Outpatient Medications   Medication Sig Dispense Refill   ALPRAZolam (XANAX) 0.25 MG tablet Take 0.25 mg by mouth daily as needed for anxiety.     omeprazole (PRILOSEC) 40 MG capsule Take 40 mg by mouth daily.     semaglutide-weight management (WEGOVY) 0.25 MG/0.5ML SOAJ SQ injection Inject 0.25 mg into the skin every 14 (fourteen) days.     Current Facility-Administered Medications  Medication Dose Route Frequency Provider Last Rate Last Admin   diphenhydrAMINE  (BENADRYL ) capsule 25 mg  25 mg Oral Q6H PRN Maree Isles, MD   25 mg at 09/22/23 [phone]    REVIEW OF SYSTEMS:   Constitutional: Denies fevers, chills or abnormal night sweats Eyes: Denies blurriness of vision, double vision or watery eyes Ears, nose, mouth, throat, and face: Denies mucositis or sore throat Respiratory: Denies cough, dyspnea or wheezes Cardiovascular: Denies palpitation, chest discomfort or lower extremity swelling Gastrointestinal:  Denies nausea, heartburn or change in bowel habits Skin: Denies abnormal skin rashes Lymphatics: Denies new lymphadenopathy or easy bruising Neurological:Denies numbness, tingling or new weaknesses Behavioral/Psych: Mood is stable, no new changes  All other systems were reviewed with the patient and are negative.  PHYSICAL EXAMINATION: ECOG PERFORMANCE STATUS: 1 - Symptomatic but completely ambulatory  Vitals:   03/05/24 1147  BP: (!) 105/59  Pulse: 73  Resp: 18  Temp: 98.8 F (37.1 C)  SpO2: 100%   Filed Weights   03/05/24 1147  Weight: 133 lb (60.3 kg)    GENERAL:alert, no distress and comfortable SKIN: skin color, texture, turgor are normal, no rashes or significant lesions EYES: normal, conjunctiva are pink and non-injected, sclera clear OROPHARYNX:no exudate, no erythema and lips, buccal mucosa, and tongue normal  NECK: supple, thyroid normal size, non-tender, without nodularity LYMPH:  no palpable lymphadenopathy in the cervical, axillary or inguinal LUNGS: clear to auscultation and  percussion with normal breathing effort HEART: regular rate & rhythm and no murmurs and no lower extremity edema ABDOMEN:abdomen soft, non-tender and normal bowel sounds Musculoskeletal:no cyanosis of digits and no clubbing  PSYCH: alert & oriented x 3 with fluent speech NEURO: no focal motor/sensory deficits  Physical Exam   LABORATORY DATA:  I have reviewed the data as listed    Latest Ref Rng & Units 03/05/2024   12:15 PM  CBC  WBC 4.0 - 10.5 K/uL 5.3   Hemoglobin 12.0 - 15.0 g/dL 87.9   Hematocrit 63.9 - 46.0 % 37.2   Platelets 150 - 400 K/uL 289        No data to display           RADIOGRAPHIC STUDIES: I have personally reviewed the radiological images as listed and agreed with the findings in the report. No results found.   Assessment & Plan Iron  deficiency anemia due to chronic blood loss Chronic iron  deficiency anemia likely secondary to heavy menstrual bleeding. Symptoms include fatigue and lack of energy, improving temporarily post-iron  infusions. Heavy menstrual bleeding with increased clotting over the  last two months. Previous oral iron  supplements and iron  infusions provided temporary relief. No infusion reactions reported. Differential includes potential fibroids or other gynecological issues contributing to heavy bleeding. Discussed potential gynecological interventions such as endometrial ablation or fibroid embolization to reduce bleeding and potentially alleviate anemia. - Ordered blood counts and iron  studies to assess current anemia status. - Will schedule iron  infusions based on lab results. - Recommended follow-up with gynecologist to evaluate for fibroids or other causes of heavy menstrual bleeding. - Plan for routine lab work every 2-3 months to monitor blood counts and determine frequency of iron  infusions.  Screening for nutritional deficiencies (vitamin B12 and vitamin D ) Previous B12 level in 2021 was slightly low at 301. No recent vitamin D   level available. Symptoms of fatigue could be related to low B12 or vitamin D  levels. - Ordered vitamin B12 and vitamin D  levels to assess for deficiencies.  Plan - Labs today - Will schedule IV iron .  She has significant iron  deficiency - Follow-up with NP in 4 months with lab a few days before   Orders Placed This Encounter  Procedures   Retic Panel    Standing Status:   Future    Number of Occurrences:   1    Expiration Date:   03/05/2025   Ferritin    Standing Status:   Future    Number of Occurrences:   1    Expected Date:   03/05/2024    Expiration Date:   03/05/2025   Iron  and TIBC ([facility] ONLY)    Standing Status:   Future    Number of Occurrences:   1    Expiration Date:   03/05/2025   CBC with Differential/Platelet    Standing Status:   Future    Number of Occurrences:   1    Expected Date:   03/05/2024    Expiration Date:   03/05/2025   Vitamin B12    Standing Status:   Future    Number of Occurrences:   1    Expected Date:   03/05/2024    Expiration Date:   03/05/2025   Vitamin D  25 hydroxy    Standing Status:   Future    Number of Occurrences:   1    Expected Date:   03/05/2024    Expiration Date:   03/05/2025    All questions were answered. The patient knows to call the clinic with any problems, questions or concerns. I spent 25 minutes counseling the patient face to face. The total time spent in the appointment was 30 minutes including review of chart and various tests results, discussions about plan of care and coordination of care plan.     Onita Mattock, MD 03/05/2024

## 2024-03-21 ENCOUNTER — Ambulatory Visit: Payer: Self-pay | Admitting: Hematology

## 2024-03-23 NOTE — Telephone Encounter (Addendum)
 Called and informed patient of her lab results per Dr. Lanny.  Patient verbalized understanding and states ok.      --- Message from [facility] Norleen ORN sent at 03/23/2024 12:07 PM EST -----  ----- Message ----- From: Lanny Callander, MD Sent: 03/21/2024   7:05 PM EST To: Norleen KANDICE Spain, CMA; Anders SHAUNNA Scurry, RN; Ki#  Please let pt know her lab results from 12/5. Iron  level adequate, no need iv iron  for now. VitD level slightly low, encourage her to take OTC VitD 800-1000u daily. Lab and f/u has been scheduled.  Callander Lanny

## 2024-03-29 ENCOUNTER — Encounter: Payer: Self-pay | Admitting: *Deleted

## 2024-06-25 ENCOUNTER — Inpatient Hospital Stay

## 2024-07-02 ENCOUNTER — Inpatient Hospital Stay: Admitting: Oncology
